# Patient Record
Sex: Female | Born: 1940 | Race: White | Hispanic: No | Smoking: Never smoker
Health system: Southern US, Community
[De-identification: ages and names within clinical notes are randomized; demographics above are authoritative.]

## PROBLEM LIST (undated history)

## (undated) DIAGNOSIS — C801 Malignant (primary) neoplasm, unspecified: Secondary | ICD-10-CM

## (undated) DIAGNOSIS — M1990 Unspecified osteoarthritis, unspecified site: Secondary | ICD-10-CM

## (undated) DIAGNOSIS — J45909 Unspecified asthma, uncomplicated: Secondary | ICD-10-CM

## (undated) DIAGNOSIS — I10 Essential (primary) hypertension: Secondary | ICD-10-CM

## (undated) HISTORY — PX: ABDOMINAL HYSTERECTOMY: SHX81

## (undated) HISTORY — DX: Unspecified asthma, uncomplicated: J45.909

## (undated) HISTORY — DX: Malignant (primary) neoplasm, unspecified: C80.1

## (undated) HISTORY — PX: OTHER SURGICAL HISTORY: SHX169

## (undated) HISTORY — PX: BLADDER SURGERY: SHX569

## (undated) HISTORY — PX: POLYPECTOMY: SHX149

## (undated) HISTORY — DX: Unspecified osteoarthritis, unspecified site: M19.90

## (undated) HISTORY — DX: Essential (primary) hypertension: I10

---

## 2001-06-26 ENCOUNTER — Other Ambulatory Visit: Admission: RE | Admit: 2001-06-26 | Discharge: 2001-06-26 | Payer: Self-pay | Admitting: Family Medicine

## 2001-07-23 ENCOUNTER — Ambulatory Visit: Admission: RE | Admit: 2001-07-23 | Discharge: 2001-07-23 | Payer: Self-pay | Admitting: Gynecologic Oncology

## 2001-07-25 ENCOUNTER — Encounter: Payer: Self-pay | Admitting: Obstetrics and Gynecology

## 2001-07-29 ENCOUNTER — Inpatient Hospital Stay (HOSPITAL_COMMUNITY): Admission: RE | Admit: 2001-07-29 | Discharge: 2001-08-01 | Payer: Self-pay | Admitting: Obstetrics and Gynecology

## 2001-07-29 ENCOUNTER — Encounter (INDEPENDENT_AMBULATORY_CARE_PROVIDER_SITE_OTHER): Payer: Self-pay | Admitting: Specialist

## 2001-09-16 ENCOUNTER — Encounter: Admission: RE | Admit: 2001-09-16 | Discharge: 2001-09-16 | Payer: Self-pay | Admitting: Obstetrics and Gynecology

## 2001-09-16 ENCOUNTER — Encounter: Payer: Self-pay | Admitting: Obstetrics and Gynecology

## 2001-11-19 ENCOUNTER — Other Ambulatory Visit: Admission: RE | Admit: 2001-11-19 | Discharge: 2001-11-19 | Payer: Self-pay | Admitting: Obstetrics and Gynecology

## 2002-02-12 ENCOUNTER — Other Ambulatory Visit: Admission: RE | Admit: 2002-02-12 | Discharge: 2002-02-12 | Payer: Self-pay | Admitting: Obstetrics and Gynecology

## 2002-05-25 ENCOUNTER — Other Ambulatory Visit: Admission: RE | Admit: 2002-05-25 | Discharge: 2002-05-25 | Payer: Self-pay | Admitting: Obstetrics and Gynecology

## 2002-10-14 ENCOUNTER — Encounter: Admission: RE | Admit: 2002-10-14 | Discharge: 2002-10-14 | Payer: Self-pay | Admitting: Obstetrics and Gynecology

## 2002-10-14 ENCOUNTER — Encounter: Payer: Self-pay | Admitting: Obstetrics and Gynecology

## 2002-11-19 ENCOUNTER — Other Ambulatory Visit: Admission: RE | Admit: 2002-11-19 | Discharge: 2002-11-19 | Payer: Self-pay | Admitting: Obstetrics and Gynecology

## 2003-05-06 ENCOUNTER — Other Ambulatory Visit: Admission: RE | Admit: 2003-05-06 | Discharge: 2003-05-06 | Payer: Self-pay | Admitting: Obstetrics and Gynecology

## 2003-12-01 ENCOUNTER — Other Ambulatory Visit: Admission: RE | Admit: 2003-12-01 | Discharge: 2003-12-01 | Payer: Self-pay | Admitting: Obstetrics and Gynecology

## 2004-05-16 ENCOUNTER — Other Ambulatory Visit: Admission: RE | Admit: 2004-05-16 | Discharge: 2004-05-16 | Payer: Self-pay | Admitting: Obstetrics and Gynecology

## 2004-12-13 ENCOUNTER — Other Ambulatory Visit: Admission: RE | Admit: 2004-12-13 | Discharge: 2004-12-13 | Payer: Self-pay | Admitting: Obstetrics and Gynecology

## 2004-12-27 ENCOUNTER — Encounter: Admission: RE | Admit: 2004-12-27 | Discharge: 2004-12-27 | Payer: Self-pay | Admitting: Family Medicine

## 2005-06-19 ENCOUNTER — Other Ambulatory Visit: Admission: RE | Admit: 2005-06-19 | Discharge: 2005-06-19 | Payer: Self-pay | Admitting: Obstetrics and Gynecology

## 2006-01-01 ENCOUNTER — Encounter: Admission: RE | Admit: 2006-01-01 | Discharge: 2006-01-01 | Payer: Self-pay | Admitting: Obstetrics and Gynecology

## 2006-01-09 ENCOUNTER — Other Ambulatory Visit: Admission: RE | Admit: 2006-01-09 | Discharge: 2006-01-09 | Payer: Self-pay | Admitting: Obstetrics and Gynecology

## 2006-07-25 ENCOUNTER — Other Ambulatory Visit: Admission: RE | Admit: 2006-07-25 | Discharge: 2006-07-25 | Payer: Self-pay | Admitting: Obstetrics and Gynecology

## 2007-01-20 ENCOUNTER — Encounter: Admission: RE | Admit: 2007-01-20 | Discharge: 2007-01-20 | Payer: Self-pay | Admitting: Obstetrics and Gynecology

## 2007-07-15 ENCOUNTER — Other Ambulatory Visit: Admission: RE | Admit: 2007-07-15 | Discharge: 2007-07-15 | Payer: Self-pay | Admitting: Obstetrics and Gynecology

## 2008-11-01 ENCOUNTER — Encounter: Admission: RE | Admit: 2008-11-01 | Discharge: 2008-11-01 | Payer: Self-pay | Admitting: Family Medicine

## 2008-11-11 ENCOUNTER — Ambulatory Visit: Payer: Self-pay | Admitting: Internal Medicine

## 2008-11-25 ENCOUNTER — Ambulatory Visit: Payer: Self-pay | Admitting: Internal Medicine

## 2008-11-25 ENCOUNTER — Encounter: Payer: Self-pay | Admitting: Internal Medicine

## 2008-11-25 HISTORY — PX: COLONOSCOPY: SHX174

## 2008-11-26 ENCOUNTER — Encounter: Payer: Self-pay | Admitting: Internal Medicine

## 2010-04-19 DIAGNOSIS — J309 Allergic rhinitis, unspecified: Secondary | ICD-10-CM | POA: Insufficient documentation

## 2010-04-19 HISTORY — DX: Allergic rhinitis, unspecified: J30.9

## 2010-07-21 DIAGNOSIS — E559 Vitamin D deficiency, unspecified: Secondary | ICD-10-CM | POA: Insufficient documentation

## 2010-07-21 DIAGNOSIS — F411 Generalized anxiety disorder: Secondary | ICD-10-CM

## 2010-07-21 HISTORY — DX: Vitamin D deficiency, unspecified: E55.9

## 2010-07-21 HISTORY — DX: Generalized anxiety disorder: F41.1

## 2010-09-18 DIAGNOSIS — I10 Essential (primary) hypertension: Secondary | ICD-10-CM | POA: Insufficient documentation

## 2010-09-22 NOTE — H&P (Signed)
[facility]  Patient:    Kristen Mendoza, Kristen Mendoza Visit Number: [id number] MRN: [id number]          Service Type: Attending:  Rande Brunt. Eda Paschal, M.D. Dictated by:   Rande Brunt. Eda Paschal, M.D. Adm. Date:  07/28/01                           History and Physical  CHIEF COMPLAINT:  Adenocarcinoma of the endometrium.  HISTORY OF PRESENT ILLNESS:  The patient is a 70 year old gravida 2, para 2, AB 0, who had been seeing Dr. Karma Ganja.  She had not been on hormonal therapy, but after not having any periods since 1993, starting in January of this year, she had bleeding for six weeks.  She went to see him and he referred her because of the above.  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  She now enters the hospital for total abdominal hysterectomy, bilateral salpingo-oophorectomy, peritoneal washings, and frozen section.  She understands that pending what the above shows, she may undergo lymph node sampling as well.  Dr. Ronita Hipps is going to participate in her surgery.  PAST MEDICAL HISTORY:  The patient is hypertensive.  Her only other medical problem is rosacea.  MEDICATIONS: 1. She takes Pindolol 5 mg tablets, half a tablet daily. 2. She also takes vitamins.  PREVIOUS SURGERY: 1. T&A in 1950. 2. Knee surgery in 1974. 3. She had a bladder tack done in [place] done through an incision and    she said she had wire put in to elevate the bladder.  This was done for    urinary stress incontinence and she has been dry since then. 4. She had tubal ligation in 1977.  FAMILY HISTORY:  Noncontributory, except for a grandfather who had prostate cancer.  SOCIAL HISTORY:  She is a nonsmoker, nondrinker.  REVIEW OF SYSTEMS:  HEENT:  Negative.  CARDIAC:  Reveals hypertension. RESPIRATORY:  Negative.  GASTROINTESTINAL:  Negative.  GENITOURINARY: Negative.  NEUROMUSCULAR:   Negative.  ENDOCRINE:  Negative.  LYMPHATICS: Negative.  PHYSICAL EXAMINATION:  GENERAL:  The patient is a well-developed, well-nourished female in no acute distress.  VITAL SIGNS:  Blood pressure is 134/84, pulse is 80 and regular, respirations 16 and nonlabored.  She is afebrile.  HEENT:  All within normal limits.  NECK:  Supple.  Trachea in the midline.  Thyroid is not enlarged.  LUNGS:  Clear to P&A.  HEART:  No thrills, heaves, or murmurs.  BREASTS:  No masses.  ABDOMEN:  Soft without guarding, rebound, or masses.  PELVIC:  External and vaginal is within normal limits.  Cervix is clean.  Pap smear done showed adenocarcinoma.  Uterus is top normal size and shape. Adnexa are palpably normal.  RECTAL:  Negative.  EXTREMITIES:  Within normal limits.  ADMISSION IMPRESSION:  Moderately differentiated adenocarcinoma of the endometrium.  PLAN:  See above. Dictated by:   Rande Brunt. Eda Paschal, M.D. Attending:  Rande Brunt. Eda Paschal, M.D. DD:  07/28/01 TD:  07/29/01 Job: [id number]

## 2010-09-22 NOTE — Op Note (Signed)
[facility]  Patient:    Kristen Mendoza, Kristen Mendoza Visit Number: [id number] MRN: [id number]          Service Type: GYN Location: 4W 0460 01 Attending Physician:  Sharon Mt Dictated by:   Jackquline Denmark. Kyla Balzarine, M.D. Proc. Date: 07/29/01 Admit Date:  07/29/2001   CC:         Reuel Boom L. Eda Paschal, M.D.  Telford Nab, R.N.   Operative Report  PREOPERATIVE DIAGNOSIS:  Grade II, clinical stage I endometrial cancer.  POSTOPERATIVE DIAGNOSIS:  Grade II, clinical stage I endometrial cancer.  SURGEON: 1. John T. Kyla Balzarine, M.D. 2. Rande Brunt. Eda Paschal, M.D.  PROCEDURE: 1. Exploratory laparotomy with total abdominal hysterectomy and bilateral    salpingo-oophorectomy (Dr. Eda Paschal was primary surgeon). 2. Bilateral selective pelvic lymphadenectomy and washings (Dr. Kyla Balzarine was    primary surgeon).  ANESTHESIA:  General endotracheal.  INDICATIONS FOR PROCEDURE:  This 70 year old woman presented with menopausal bleeding, and biopsy revealed grade II endometrial adenocarcinoma.  At the time of exploratory laparotomy, the uterus was small and there was no gross evidence of extrauterine disease.  Tubes and ovaries appeared normal, and lymph nodes were palpably normal.  Frozen section results returned after bilateral selective pelvic lymphadenectomy had been performed, and revealed a grade I to II, very superficially invasive fundal lesion.  On the basis of this information, extensive periaortic lymphadenectomy was not performed.  DESCRIPTION OF PROCEDURE:  The patient was prepped and draped in the low lithotomy position using direct placement stirrups, and explored through a midline subumbilical incision.  Foley catheter was used to drain the urinary bladder.  The peritoneum was entered atraumatically after developing the incision with the scalpel and electrocautery.  Manual and visual exploration reveal the findings described above.  Normal saline 150 cc was  instilled in the abdominal cavity and collected for cytology as part of surgical staging. A Bookwalter retractor was used at bowel out of the pelvis.  The corners of the uterus were grabbed with long Kelly clamps.  Total abdominal hysterectomy and bilateral salpingo-oophorectomy was performed using 2-0 Vicryl for all sutures and ligatures unless otherwise specified.  Alternating on the right and left sides, the round ligaments bilaterally were transected using electrocautery.  The anterior and posterior leads of the broad ligament were opened, and retroperitoneal space was partially developed, allowing identification of each ureter.  The infundibulopelvic ligaments were isolated above each ureter, isolated, cross clamped, divided, and ligated with three tie and suture ligature.  The anterior peritoneal reflection of the uterus was opened, and bladder flap developed with sharp and blunt dissection with electrocautery for hemostasis.  Uterine vessels bilaterally were skeletonized, cross clamped, divided, and suture ligated.  Alternating on the right and left sides, cardinal ligament pedicles, and uterosacral ligaments pedicles were developed by clamping adjacent to the cervix, dividing and suture ligating.  Final pedicles entered the lateral vaginal fornices, and the specimen was amputated and submitted for frozen section evaluation.  The vaginal cuff was closed with locked running suture line and 0 Vicryl.  Tolerating results of frozen section pathology, bilateral selective pelvic lymphadenectomies were performed in the following manner:  On each side, the perivesical and perirectal spaces were fully developed with identification of side wall vessels and obturator nerve.  Using sharp and blunt dissection with electrocautery for hemostasis, lymph nodes anterior and medial to the external iliac vessels were harvested.  The dissection extended from the bifurcation of the common iliac artery  to the deep circumflex iliac vein bilaterally.  The vein retractor was used to elevate the external iliac vein and the lymph node fat pad laying above the obturator nerve was harvested from the obturator space on each side.  Hot packs were placed in the retroperitoneal spaces, and inspection revealed acceptable hemostasis.  At this juncture, frozen section revealed a very superficially invasive, grade I to II lesion, and it was elected not to perform extensive surgical staging with the aortic lymphadenectomy.  The pelvis was copiously lavaged with normal saline, and additional hemostasis achieved where needed with electrocautery.  All sponges and retractors were removed.  The abdominal wall was closed in layers using a mass running Smead-Jones closure of looped 0 PDS for rectus fascia and muscles, and clips to approximate the skin.  We irrigated between layers, and achieved hemostasis where needed with electrocautery.  The patient tolerated the procedure well, and was returned to the recovery room extubated and in stable condition.  ESTIMATED BLOOD LOSS:  150 cc.  TRANSFUSIONS:  None.  DRAINS:  Packed ______ fully dispended drainage.  PATHOLOGY SPECIMENS:  Uterus, tubes, and ovaries.  Right and left external iliac lymph nodes.  Right and left obturator lymph nodes. Dictated by:   Jackquline Denmark. Kyla Balzarine, M.D. Attending Physician:  Sharon Mt DD:  07/29/01 TD:  07/30/01 Job: [id number]

## 2010-09-22 NOTE — Discharge Summary (Signed)
[facility]  Patient:    Kristen Mendoza, Kristen Mendoza Visit Number: [id number] MRN: [id number]          Service Type: GYN Location: 4W 0460 01 Attending Physician:  Sharon Mt Dictated by:   Rande Brunt. Eda Paschal, M.D. Admit Date:  07/29/2001 Discharge Date: 08/01/2001   CC:         John T. Kyla Balzarine, M.D.   Discharge Summary  HISTORY OF PRESENT ILLNESS:  The patient is a 70 year old female who was found after postmenopausal bleeding to have an adenocarcinoma of the endometrium. As a result of this, she was admitted to the hospital on March 25.  She was taken to the operating room by Dr. Eda Paschal and Dr. Kyla Balzarine.  She underwent total abdominal hysterectomy, bilateral salpingo-oophorectomy, peritoneal washings, and bilateral selective pelvic lymphadenectomy.  Frozen section revealed that there was only superficial invasion of the myometrium, and therefore the surgery was completed at that point.  Postoperatively, she had a slight ileus which cleared with IV fluids.  By the third postoperative day, she was ready for discharge.  DISCHARGE DIET:  Regular.  DISCHARGE ACTIVITY:  Ambulatory.  DISCHARGE MEDICATIONS: 1. Darvocet-N 100. 2. Restoril for sleep.  WOUND CARE:  Routine.  FOLLOWUP:  She is to return to the office in four days for staple removal.  FINAL PATHOLOGY REPORT:  Uterus, ovaries, and fallopian tubes revealed an endometrial adenocarcinoma.  FIGO grade 2 with superficial myometrial invasion.  Left external iliac lymph nodes:  Seven benign.  Left obturator lymph nodes:  Five benign.  Right external iliac lymph nodes:  Three benign. Right obturator lymph nodes:  Five benign.  Peritoneal washings:  No malignant cells identified.  CONDITION ON DISCHARGE:  Improved.  DISCHARGE DIAGNOSIS:  Endometrial adenocarcinoma, stage IB/II.  OPERATIONS:  Total abdominal hysterectomy, bilateral salpingo-oophorectomy, selective pelvic lymphadenectomy with  peritoneal washings. Dictated by:   Rande Brunt. Eda Paschal, M.D. Attending Physician:  Sharon Mt DD:  08/12/01 TD:  08/12/01 Job: [id number]

## 2010-09-22 NOTE — Consult Note (Signed)
[facility]  Patient:    Kristen, Mendoza Visit Number: [id number] MRN: [id number]          Service Type: GON Location: GYN Attending Physician:  Sabino Donovan Dictated by:   Jackquline Denmark. Kyla Balzarine, M.D. Proc. Date: 07/23/01 Admit Date:  07/23/2001   CC:         Kristen Mendoza, M.D.  Dr. Deberah Pelton, R.N.   Consultation Report  REDICTATION TO REPLACE REPORT [phone].  CHIEF COMPLAINT:  This patient is referred by Dr. Eda Mendoza for evaluation of moderately differentiated endometrial adenocarcinoma.  HISTORY OF PRESENT ILLNESS:  This 70 year old woman underwent menopause approximately nine years ago and was never on hormonal replacement therapy. She began vaginal spotting approximately two months ago and had heavy bleeding "like a period" approximately 2 1/2 weeks ago. She was seen by her primary physician and Pap smear was suspicious for endometrial adenocarcinoma. Dr. Eda Mendoza evaluated the patient on June 30, 2001 with endometrial biopsy containing grade 2 endometrioid adenocarcinoma mixed with atypical hyperplasia elements. The patient has continued to have spotting but has not had heavy bleeding since.  Of note, she has had a "bladder tack" where she states that her bladder neck was "suspended" with wire to the pubis in 1989; records of this surgery are not available.  PAST MEDICAL HISTORY:  Significant for hypertension.  PAST SURGICAL HISTORY:  Includes "bladder tack" as above, remote T&A, remote knee surgery and remote laparoscopic BTL.  MEDICATIONS:  1. Pindolol.  2. ______.  3. Vitamins.  ALLERGIES:  None known.  FAMILY HISTORY:  Noncontributory, without breast or ovarian cancer.  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  REVIEW OF SYSTEMS:  SYSTEMIC:  Denies weight loss, fever or chills. ENT: Denies pharyngitis, sinusitis, recent abscess tooth. Denies goiter or  thyroid disease. HEME/LYMPH:  Denies abnormal bleeding or lymphadenopathy. BREASTS: Denies breast mass or discharge, denies mastodynia or focal tenderness. Reports up to date on mammograms. CARDIOPULMONARY:  Denies dyspnea, PND, orthopnea, chest pain suggesting angina or symptoms of congestive heart failure.  GI:  Denies GERD, increasing satiety or bloating, change in bowel function including constipation or diarrhea. Denies melena or hematochezia. Denies tenesmus. GU:  Denies frequent UTIs, urinary incontinence, or urgency. Denies hematuria. OB/GYN:  As per HPI. NEUROLOGIC:  Denies ______, strokes, epilepsy, frequent headaches, depressive type symptoms. ENDOCRINE:  Denies thyroid disease or diabetes.  PHYSICAL EXAMINATION:  VITAL SIGNS:  Weight 178 pounds, blood pressure 150/92, pulse 68, respirations 18.  GENERAL:  The patient is alert and oriented x3 in no acute distress.  ENT:  Benign with clear oropharynx.  LUNGS:  Fields are clear.  HEART:  Sounds regular rate and rhythm without JVD.  ABDOMEN:  Soft and benign without ascites, mass, or organomegaly. A very low transverse incision is well healed as is an umbilical laparoscopic port site. BACK:  There is no back or CVA tenderness.  EXTREMITIES:  Have full range of motion without significant edema. There is on pathologic lymphadenopathy.  PELVIC:  External genitalia and BUS are normal to inspection and palpation. The bladder and urethra are well supported and vagina is clear. Cervix is mobile without lesions. Bimanual and rectovaginal examinations reveal upper limit size uterus, poorly defined secondary to weight and habitus. There is no obvious adnexal mass or parametrial nodularity. Rectovaginal is confirmatory.  ASSESSMENT:  VIGO grade 2 endometrioid adenocarcinoma.  PLAN:  The patient has been discussed with Dr. Eda Mendoza and surgery tentatively  scheduled for next week. I had a lengthy discussion with the patient  regarding the nature of her diagnosis and planned treatment. We both have recommended an exploratory laparotomy with TAH/BSO and peritoneal washings. The uterus would be submitted for frozen section evaluation. While awaiting frozen section, routinely perform bilateral selective pelvic lymphadenectomies, as the incidence of lymph node metastasis even in stage 1A grade 2 uterine malignancies is measurable and she is a young and relatively healthy patient. If deep myometrial penetration, cervical involvement or a more histologically advanced lesion were detected, extensive lymph node sampling including pelvic and aortic lymphadenectomies would be performed. I answered questions posed by the patient regarding the risks and benefits of this approach. We discussed tailoring treatment to the results of surgical staging. The patient appeared satisfied with this information and agrees to surgery as scheduled. Dictated by:   Jackquline Denmark. Kyla Balzarine, M.D. Attending Physician:  Ronita Hipps T DD:  07/23/01 TD:  07/24/01 Job: [id number]

## 2011-07-02 DIAGNOSIS — F5102 Adjustment insomnia: Secondary | ICD-10-CM | POA: Insufficient documentation

## 2011-10-16 ENCOUNTER — Other Ambulatory Visit: Payer: Self-pay | Admitting: Family Medicine

## 2011-10-16 DIAGNOSIS — Z1231 Encounter for screening mammogram for malignant neoplasm of breast: Secondary | ICD-10-CM

## 2011-10-16 DIAGNOSIS — Z780 Asymptomatic menopausal state: Secondary | ICD-10-CM

## 2011-10-30 ENCOUNTER — Ambulatory Visit
Admission: RE | Admit: 2011-10-30 | Discharge: 2011-10-30 | Disposition: A | Discharge status: Home / Self Care | Payer: Medicare Other | Source: Ambulatory Visit | Attending: Family Medicine | Admitting: Family Medicine

## 2011-10-30 DIAGNOSIS — Z780 Asymptomatic menopausal state: Secondary | ICD-10-CM

## 2011-10-30 DIAGNOSIS — Z1231 Encounter for screening mammogram for malignant neoplasm of breast: Secondary | ICD-10-CM

## 2013-11-18 ENCOUNTER — Encounter: Payer: Self-pay | Admitting: Internal Medicine

## 2014-06-24 ENCOUNTER — Other Ambulatory Visit: Payer: Self-pay | Admitting: Family Medicine

## 2014-06-24 DIAGNOSIS — Z1231 Encounter for screening mammogram for malignant neoplasm of breast: Secondary | ICD-10-CM

## 2014-07-01 ENCOUNTER — Ambulatory Visit
Admission: RE | Admit: 2014-07-01 | Discharge: 2014-07-01 | Disposition: A | Discharge status: Home / Self Care | Payer: Medicare Other | Source: Ambulatory Visit | Attending: Family Medicine | Admitting: Family Medicine

## 2014-07-01 DIAGNOSIS — Z1231 Encounter for screening mammogram for malignant neoplasm of breast: Secondary | ICD-10-CM

## 2014-12-01 ENCOUNTER — Encounter: Payer: Self-pay | Admitting: Internal Medicine

## 2015-09-23 ENCOUNTER — Encounter: Payer: Self-pay | Admitting: Internal Medicine

## 2015-11-09 ENCOUNTER — Ambulatory Visit (AMBULATORY_SURGERY_CENTER): Payer: Self-pay | Admitting: *Deleted

## 2015-11-09 VITALS — Ht 62.0 in | Wt 163.0 lb

## 2015-11-09 DIAGNOSIS — Z86010 Personal history of colonic polyps: Secondary | ICD-10-CM

## 2015-11-09 MED ORDER — NA SULFATE-K SULFATE-MG SULF 17.5-3.13-1.6 GM/177ML PO SOLN
1.0000 | Freq: Once | ORAL | Status: DC
Start: 2015-11-09 — End: 2016-01-30

## 2015-11-09 NOTE — Progress Notes (Signed)
No egg or soy allergy known to patient  No issues with past sedation with any surgeries  or procedures, no intubation problems  No diet pills per patient No home 02 use per patient  No blood thinners per patient  Pt denies issues with constipation  emmi declined suprep 112$ ( husband just did a colon )- sample given Medication Samples have been provided to the patient.  Drug name: suprep     Qty: 1  LOTZK:[id number]  Exp.Date: 3-19  The patient has been instructed regarding the correct time, dose, and frequency of taking this medication, including desired effects and most common side effects.   Kristen Mendoza 1:29 PM 11/09/2015

## 2015-11-11 ENCOUNTER — Encounter: Payer: Self-pay | Admitting: Internal Medicine

## 2015-11-23 ENCOUNTER — Encounter: Payer: BC Managed Care – PPO | Admitting: Internal Medicine

## 2016-01-30 ENCOUNTER — Ambulatory Visit (AMBULATORY_SURGERY_CENTER): Payer: Medicare Other | Admitting: Internal Medicine

## 2016-01-30 ENCOUNTER — Encounter: Payer: Self-pay | Admitting: Internal Medicine

## 2016-01-30 VITALS — BP 113/66 | HR 61 | Temp 97.5°F | Resp 15 | Ht 62.0 in | Wt 163.0 lb

## 2016-01-30 DIAGNOSIS — D120 Benign neoplasm of cecum: Secondary | ICD-10-CM

## 2016-01-30 DIAGNOSIS — Z86010 Personal history of colonic polyps: Secondary | ICD-10-CM | POA: Diagnosis not present

## 2016-01-30 MED ORDER — SODIUM CHLORIDE 0.9 % IV SOLN: 500.0000 mL | INTRAVENOUS | Status: DC

## 2016-01-30 NOTE — Progress Notes (Signed)
Called to room to assist during endoscopic procedure.  Patient ID and intended procedure confirmed with present staff. Received instructions for my participation in the procedure from the performing physician.  

## 2016-01-30 NOTE — Patient Instructions (Signed)
YOU HAD AN ENDOSCOPIC PROCEDURE TODAY AT [facility]:   Refer to the procedure report that was given to you for any specific questions about what was found during the examination.  If the procedure report does not answer your questions, please call your gastroenterologist to clarify.  If you requested that your care partner not be given the details of your procedure findings, then the procedure report has been included in a sealed envelope for you to review at your convenience later.  YOU SHOULD EXPECT: Some feelings of bloating in the abdomen. Passage of more gas than usual.  Walking can help get rid of the air that was put into your GI tract during the procedure and reduce the bloating. If you had a lower endoscopy (such as a colonoscopy or flexible sigmoidoscopy) you may notice spotting of blood in your stool or on the toilet paper. If you underwent a bowel prep for your procedure, you may not have a normal bowel movement for a few days.  Please Note:  You might notice some irritation and congestion in your nose or some drainage.  This is from the oxygen used during your procedure.  There is no need for concern and it should clear up in a day or so.  SYMPTOMS TO REPORT IMMEDIATELY:   Following lower endoscopy (colonoscopy or flexible sigmoidoscopy):  Excessive amounts of blood in the stool  Significant tenderness or worsening of abdominal pains  Swelling of the abdomen that is new, acute  Fever of 100F or higher   Following upper endoscopy (EGD)  Vomiting of blood or coffee ground material  New chest pain or pain under the shoulder blades  Painful or persistently difficult swallowing  New shortness of breath  Fever of 100F or higher  Black, tarry-looking stools  For urgent or emergent issues, a gastroenterologist can be reached at any hour by calling [phone].   DIET:  We do recommend a small meal at first, but then you may proceed to your regular diet.  Drink  plenty of fluids but you should avoid alcoholic beverages for 24 hours.  ACTIVITY:  You should plan to take it easy for the rest of today and you should NOT DRIVE or use heavy machinery until tomorrow (because of the sedation medicines used during the test).    FOLLOW UP: Our staff will call the number listed on your records the next business day following your procedure to check on you and address any questions or concerns that you may have regarding the information given to you following your procedure. If we do not reach you, we will leave a message.  However, if you are feeling well and you are not experiencing any problems, there is no need to return our call.  We will assume that you have returned to your regular daily activities without incident.  If any biopsies were taken you will be contacted by phone or by letter within the next 1-3 weeks.  Please call us at [phone] if you have not heard about the biopsies in 3 weeks.    SIGNATURES/CONFIDENTIALITY: You and/or your care partner have signed paperwork which will be entered into your electronic medical record.  These signatures attest to the fact that that the information above on your After Visit Summary has been reviewed and is understood.  Full responsibility of the confidentiality of this discharge information lies with you and/or your care-partner.   Handouts were given to your care partner on polyps, hemorrhoids  diverticulosis, and a high fiber diet with liberal fluid intake You may resume your current medications today. Await biopsy results. Please call if any questions or concerns.

## 2016-01-30 NOTE — Op Note (Signed)
[facility] Patient Name: Kristen Mendoza Procedure Date: 01/30/2016 9:49 AM MRN: [id number] Endoscopist: Docia Chuck. Henrene Pastor , MD Age: 75 Referring MD:  Date of Birth: 12-06-1940 Gender: Female Account #: [id number] Procedure:                Colonoscopy Indications:              Surveillance: Personal history of adenomatous                            polyps on last colonoscopy > 5 years ago, High risk                            colon cancer surveillance: Personal history of                            non-advanced adenoma Medicines:                Monitored Anesthesia Care Procedure:                Pre-Anesthesia Assessment:                           - Prior to the procedure, a History and Physical                            was performed, and patient medications and                            allergies were reviewed. The patient's tolerance of                            previous anesthesia was also reviewed. The risks                            and benefits of the procedure and the sedation                            options and risks were discussed with the patient.                            All questions were answered, and informed consent                            was obtained. Prior Anticoagulants: The patient has                            taken no previous anticoagulant or antiplatelet                            agents. ASA Grade Assessment: II - A patient with                            mild systemic disease. After reviewing the risks  and benefits, the patient was deemed in                            satisfactory condition to undergo the procedure.                           After obtaining informed consent, the colonoscope                            was passed under direct vision. Throughout the                            procedure, the patient's blood pressure, pulse, and                            oxygen saturations were monitored continuously.  The                            Model PCF-[id number]) scope was introduced                            through the anus and advanced to the the cecum,                            identified by appendiceal orifice and ileocecal                            valve. The ileocecal valve, appendiceal orifice,                            and rectum were photographed. The quality of the                            bowel preparation was excellent. The colonoscopy                            was performed without difficulty. The patient                            tolerated the procedure well. The bowel preparation                            used was SUPREP. Scope In: 10:00:08 AM Scope Out: 10:15:18 AM Scope Withdrawal Time: 0 hours 10 minutes 19 seconds  Total Procedure Duration: 0 hours 15 minutes 10 seconds  Findings:                 A 5 mm polyp was found in the cecum. The polyp was                            removed with a cold snare. Resection and retrieval                            were complete.  Multiple diverticula were found in the sigmoid                            colon, with significant stenosis of rectosigmoid                            junction.                           Internal hemorrhoids were found during retroflexion.                           The exam was otherwise without abnormality on                            direct and retroflexion views. Complications:            No immediate complications. Estimated blood loss:                            None. Estimated Blood Loss:     Estimated blood loss: none. Impression:               - One 5 mm polyp in the cecum, removed with a cold                            snare. Resected and retrieved.                           - Diverticulosis in the sigmoid colon, with                            stenosis.                           - Internal hemorrhoids.                           - The examination was otherwise  normal on direct                            and retroflexion views. Recommendation:           - Repeat colonoscopy is not recommended for                            surveillance, given current age and favorable                            findings on today's exam.                           - Patient has a contact number available for                            emergencies. The signs and symptoms of potential  delayed complications were discussed with the                            patient. Return to normal activities tomorrow.                            Written discharge instructions were provided to the                            patient.                           - Resume previous diet.                           - Continue present medications.                           - Await pathology results. Docia Chuck. Henrene Pastor, MD 01/30/2016 10:20:30 AM This report has been signed electronically.

## 2016-01-30 NOTE — Progress Notes (Signed)
No problems noted in the recovery room. maw 

## 2016-01-30 NOTE — Progress Notes (Signed)
Report given to PACU RN, vss 

## 2016-01-31 ENCOUNTER — Telehealth: Payer: Self-pay | Admitting: *Deleted

## 2016-01-31 NOTE — Telephone Encounter (Signed)
  Follow up Call-  Call back number 01/30/2016  Post procedure Call Back phone  # [phone]  Permission to leave phone message Yes  Some recent data might be hidden     Patient questions:  Do you have a fever, pain , or abdominal swelling? No. Pain Score  0 *  Have you tolerated food without any problems? Yes.    Have you been able to return to your normal activities? Yes.    Do you have any questions about your discharge instructions: Diet   No. Medications  No. Follow up visit  No.  Do you have questions or concerns about your Care? No.  Actions: * If pain score is 4 or above: No action needed, pain <4.

## 2016-02-06 ENCOUNTER — Encounter: Payer: Self-pay | Admitting: Internal Medicine

## 2016-10-10 ENCOUNTER — Other Ambulatory Visit: Payer: Self-pay | Admitting: Family Medicine

## 2016-10-10 DIAGNOSIS — Z1231 Encounter for screening mammogram for malignant neoplasm of breast: Secondary | ICD-10-CM

## 2016-10-29 ENCOUNTER — Ambulatory Visit
Admission: RE | Admit: 2016-10-29 | Discharge: 2016-10-29 | Disposition: A | Discharge status: Home / Self Care | Payer: Medicare Other | Source: Ambulatory Visit | Attending: Family Medicine | Admitting: Family Medicine

## 2016-10-29 DIAGNOSIS — Z1231 Encounter for screening mammogram for malignant neoplasm of breast: Secondary | ICD-10-CM

## 2017-11-27 ENCOUNTER — Other Ambulatory Visit: Payer: Self-pay | Admitting: Family Medicine

## 2017-11-27 DIAGNOSIS — E2839 Other primary ovarian failure: Secondary | ICD-10-CM

## 2017-11-27 DIAGNOSIS — Z1231 Encounter for screening mammogram for malignant neoplasm of breast: Secondary | ICD-10-CM

## 2018-01-29 ENCOUNTER — Ambulatory Visit
Admission: RE | Admit: 2018-01-29 | Discharge: 2018-01-29 | Disposition: A | Discharge status: Home / Self Care | Payer: Medicare Other | Source: Ambulatory Visit | Attending: Family Medicine | Admitting: Family Medicine

## 2018-01-29 DIAGNOSIS — E2839 Other primary ovarian failure: Secondary | ICD-10-CM

## 2018-01-29 DIAGNOSIS — Z1231 Encounter for screening mammogram for malignant neoplasm of breast: Secondary | ICD-10-CM

## 2019-03-06 ENCOUNTER — Other Ambulatory Visit (HOSPITAL_BASED_OUTPATIENT_CLINIC_OR_DEPARTMENT_OTHER): Payer: Self-pay | Admitting: Family Medicine

## 2019-03-06 DIAGNOSIS — Z1231 Encounter for screening mammogram for malignant neoplasm of breast: Secondary | ICD-10-CM

## 2019-03-10 ENCOUNTER — Other Ambulatory Visit: Payer: Self-pay

## 2019-03-10 ENCOUNTER — Ambulatory Visit (HOSPITAL_BASED_OUTPATIENT_CLINIC_OR_DEPARTMENT_OTHER)
Admission: RE | Admit: 2019-03-10 | Discharge: 2019-03-10 | Disposition: A | Discharge status: Home / Self Care | Payer: Medicare Other | Source: Ambulatory Visit | Attending: Family Medicine | Admitting: Family Medicine

## 2019-03-10 DIAGNOSIS — Z1231 Encounter for screening mammogram for malignant neoplasm of breast: Secondary | ICD-10-CM | POA: Diagnosis not present

## 2019-06-15 ENCOUNTER — Ambulatory Visit: Payer: Medicare PPO

## 2019-06-19 ENCOUNTER — Ambulatory Visit: Payer: Medicare PPO | Attending: Internal Medicine

## 2019-06-19 DIAGNOSIS — Z23 Encounter for immunization: Secondary | ICD-10-CM | POA: Insufficient documentation

## 2019-06-19 NOTE — Progress Notes (Signed)
   Covid-19 Vaccination Clinic  Name:  Kristen Mendoza    MRN: [id number] DOB: 09/03/1940  06/19/2019  [name] was observed post Covid-19 immunization for 15 minutes without incidence. She was provided with Vaccine Information Sheet and instruction to access the V-Safe system.   [name] was instructed to call 911 with any severe reactions post vaccine: Marland Kitchen Difficulty breathing  . Swelling of your face and throat  . A fast heartbeat  . A bad rash all over your body  . Dizziness and weakness    Immunizations Administered    Name Date Dose VIS Date Route   Pfizer COVID-19 Vaccine 06/19/2019  1:59 PM 0.3 mL 04/17/2019 Intramuscular   Manufacturer: [place]   Lot: [id number]   [place]: [id number]

## 2019-07-12 ENCOUNTER — Ambulatory Visit: Payer: Medicare PPO | Attending: Internal Medicine

## 2019-07-12 DIAGNOSIS — Z23 Encounter for immunization: Secondary | ICD-10-CM

## 2019-07-12 NOTE — Progress Notes (Signed)
   Covid-19 Vaccination Clinic  Name:  Kristen Mendoza    MRN: [id number] DOB: 1940-05-24  07/12/2019  [name] was observed post Covid-19 immunization for 15 minutes without incident. She was provided with Vaccine Information Sheet and instruction to access the V-Safe system.   [name] was instructed to call 911 with any severe reactions post vaccine: Marland Kitchen Difficulty breathing  . Swelling of face and throat  . A fast heartbeat  . A bad rash all over body  . Dizziness and weakness   Immunizations Administered    Name Date Dose VIS Date Route   Pfizer COVID-19 Vaccine 07/12/2019 12:24 PM 0.3 mL 04/17/2019 Intramuscular   Manufacturer: [place]   Lot: [id number]   [place]: [id number]

## 2019-08-13 ENCOUNTER — Encounter: Payer: Self-pay | Admitting: Internal Medicine

## 2019-08-13 ENCOUNTER — Other Ambulatory Visit: Payer: Self-pay

## 2019-08-13 ENCOUNTER — Ambulatory Visit: Payer: Medicare PPO | Admitting: Internal Medicine

## 2019-08-13 ENCOUNTER — Encounter (INDEPENDENT_AMBULATORY_CARE_PROVIDER_SITE_OTHER): Payer: Self-pay

## 2019-08-13 VITALS — BP 160/82 | HR 88 | Temp 98.0°F | Ht 62.0 in | Wt 162.8 lb

## 2019-08-13 DIAGNOSIS — E538 Deficiency of other specified B group vitamins: Secondary | ICD-10-CM

## 2019-08-13 DIAGNOSIS — C801 Malignant (primary) neoplasm, unspecified: Secondary | ICD-10-CM | POA: Insufficient documentation

## 2019-08-13 DIAGNOSIS — Z0000 Encounter for general adult medical examination without abnormal findings: Secondary | ICD-10-CM

## 2019-08-13 DIAGNOSIS — M1990 Unspecified osteoarthritis, unspecified site: Secondary | ICD-10-CM | POA: Insufficient documentation

## 2019-08-13 DIAGNOSIS — E559 Vitamin D deficiency, unspecified: Secondary | ICD-10-CM | POA: Diagnosis not present

## 2019-08-13 DIAGNOSIS — J45909 Unspecified asthma, uncomplicated: Secondary | ICD-10-CM | POA: Insufficient documentation

## 2019-08-13 DIAGNOSIS — Z23 Encounter for immunization: Secondary | ICD-10-CM

## 2019-08-13 DIAGNOSIS — I10 Essential (primary) hypertension: Secondary | ICD-10-CM | POA: Diagnosis not present

## 2019-08-13 DIAGNOSIS — Z0001 Encounter for general adult medical examination with abnormal findings: Secondary | ICD-10-CM | POA: Insufficient documentation

## 2019-08-13 LAB — TSH: TSH: 2.55 u[IU]/mL (ref 0.35–4.50)

## 2019-08-13 LAB — CBC WITH DIFFERENTIAL/PLATELET
Basophils Absolute: 0.1 K/uL (ref 0.0–0.1)
Basophils Relative: 0.8 % (ref 0.0–3.0)
Eosinophils Absolute: 0.1 K/uL (ref 0.0–0.7)
Eosinophils Relative: 1.2 % (ref 0.0–5.0)
HCT: 40.4 % (ref 36.0–46.0)
Hemoglobin: 13.6 g/dL (ref 12.0–15.0)
Lymphocytes Relative: 25.9 % (ref 12.0–46.0)
Lymphs Abs: 2.3 K/uL (ref 0.7–4.0)
MCHC: 33.6 g/dL (ref 30.0–36.0)
MCV: 93.5 fl (ref 78.0–100.0)
Monocytes Absolute: 0.8 K/uL (ref 0.1–1.0)
Monocytes Relative: 8.6 % (ref 3.0–12.0)
Neutro Abs: 5.7 K/uL (ref 1.4–7.7)
Neutrophils Relative %: 63.5 % (ref 43.0–77.0)
Platelets: 328 K/uL (ref 150.0–400.0)
RBC: 4.32 Mil/uL (ref 3.87–5.11)
RDW: 14 % (ref 11.5–15.5)
WBC: 8.9 K/uL (ref 4.0–10.5)

## 2019-08-13 LAB — URINALYSIS, ROUTINE W REFLEX MICROSCOPIC
Bilirubin Urine: NEGATIVE
Hgb urine dipstick: NEGATIVE
Leukocytes,Ua: NEGATIVE
Nitrite: NEGATIVE
RBC / HPF: NONE SEEN
Specific Gravity, Urine: >=1.03 — AB (ref 1.000–1.030)
Total Protein, Urine: NEGATIVE
Urine Glucose: NEGATIVE
Urobilinogen, UA: 0.2 (ref 0.0–1.0)
WBC, UA: NONE SEEN
pH: 5.5 (ref 5.0–8.0)

## 2019-08-13 LAB — HEPATIC FUNCTION PANEL
ALT: 13 U/L (ref 0–35)
AST: 19 U/L (ref 0–37)
Albumin: 4.4 g/dL (ref 3.5–5.2)
Alkaline Phosphatase: 76 U/L (ref 39–117)
Bilirubin, Direct: 0.2 mg/dL (ref 0.0–0.3)
Total Bilirubin: 1.1 mg/dL (ref 0.2–1.2)
Total Protein: 7.2 g/dL (ref 6.0–8.3)

## 2019-08-13 LAB — BASIC METABOLIC PANEL
BUN: 19 mg/dL (ref 6–23)
CO2: 27 mEq/L (ref 19–32)
Calcium: 9.5 mg/dL (ref 8.4–10.5)
Chloride: 103 mEq/L (ref 96–112)
Creatinine, Ser: 0.83 mg/dL (ref 0.40–1.20)
GFR: 66.71 mL/min (ref >60.00)
Glucose, Bld: 99 mg/dL (ref 70–99)
Potassium: 3.8 mEq/L (ref 3.5–5.1)
Sodium: 139 mEq/L (ref 135–145)

## 2019-08-13 LAB — LIPID PANEL
Cholesterol: 191 mg/dL (ref 0–200)
HDL: 65.3 mg/dL
LDL Cholesterol: 107 mg/dL — ABNORMAL HIGH (ref 0–99)
NonHDL: 125.6
Total CHOL/HDL Ratio: 3
Triglycerides: 95 mg/dL (ref 0.0–149.0)
VLDL: 19 mg/dL (ref 0.0–40.0)

## 2019-08-13 LAB — VITAMIN B12: Vitamin B-12: 861 pg/mL (ref 211–911)

## 2019-08-13 LAB — VITAMIN D 25 HYDROXY (VIT D DEFICIENCY, FRACTURES): VITD: 30.88 ng/mL (ref 30.00–100.00)

## 2019-08-13 MED ORDER — OLMESARTAN MEDOXOMIL 20 MG PO TABS: 10.0000 mg | ORAL_TABLET | Freq: Every day | ORAL | 3 refills | Status: DC

## 2019-08-13 NOTE — Progress Notes (Signed)
Subjective:    Patient ID: Kristen Mendoza, female    DOB: [birth year], 79 y.o.   MRN: [id number]  HPI  Here for wellness and f/u;  Overall doing ok;  Pt denies Chest pain, worsening SOB, DOE, wheezing, orthopnea, PND, worsening LE edema, palpitations, dizziness or syncope.  Pt denies neurological change such as new headache, facial or extremity weakness.  Pt denies polydipsia, polyuria, or low sugar symptoms. Pt states overall good compliance with treatment and medications, good tolerability, and has been trying to follow appropriate diet.  Pt denies worsening depressive symptoms, suicidal ideation or panic. No fever, night sweats, wt loss, loss of appetite, or other constitutional symptoms.  Pt states good ability with ADL's, has low fall risk, home safety reviewed and adequate, no other significant changes in hearing or vision, and only occasionally active with exercise. Out of BP meds, just didn't want to take it, usually lower than 140/90 at home.  Lots of stress with husband dementia.  Past Medical History:  Diagnosis Date  . Allergic rhinitis 04/19/2010   Overview:  Allergic Rhinitis  10/1 IMO update  . Anxiety state 07/21/2010   Overview:  Anxiety Disorder NOS  10/1 IMO update  . Arthritis    right knee  . Asthma    dx'd years ago but no current issues   . Cancer [facility])    endometrial cancer-14 yeras ago- surgery only  . Hypertension   . Vitamin D deficiency 07/21/2010   Overview:  Vitamin D Deficiency  10/1 IMO update   Past Surgical History:  Procedure Laterality Date  . ABDOMINAL HYSTERECTOMY    . BLADDER SURGERY     bladder tack   . COLONOSCOPY  11-25-2008   polyps, tics, hems- TA x 1-[facility]ry   . knee cap dislocation     40+ years ago  . POLYPECTOMY      reports that she has never smoked. She has never used smokeless tobacco. She reports that she does not drink alcohol or use drugs. family history is not on file. No Known Allergies Current Outpatient Medications on File Prior  to Visit  Medication Sig Dispense Refill  . amLODipine (NORVASC) 5 MG tablet Take 5 mg by mouth daily.    . Cholecalciferol (VITAMIN D3) 5000 units TABS Take 2 tablets by mouth daily.     Marland Kitchen losartan (COZAAR) 100 MG tablet Take 100 mg by mouth daily.     Current Facility-Administered Medications on File Prior to Visit  Medication Dose Route Frequency Provider Last Rate Last Admin  . 0.9 %  sodium chloride infusion  500 mL Intravenous Continuous Irene Shipper, MD       Review of Systems All otherwise neg per pt     Objective:   Physical Exam BP (!) 160/82   Pulse 88   Temp 98 F (36.7 C)   Ht 5\' 2"  (1.575 m)   Wt 162 lb 12.8 oz (73.8 kg)   SpO2 98%   BMI 29.78 kg/m  VS noted,  Constitutional: Pt appears in NAD HENT: Head: NCAT.  Right Ear: External ear normal.  Left Ear: External ear normal.  Eyes: . Pupils are equal, round, and reactive to light. Conjunctivae and EOM are normal Nose: without d/c or deformity Neck: Neck supple. Gross normal ROM Cardiovascular: Normal rate and regular rhythm.   Pulmonary/Chest: Effort normal and breath sounds without rales or wheezing.  Abd:  Soft, NT, ND, + BS, no organomegaly Neurological: Pt is alert. At baseline  orientation, motor grossly intact Skin: Skin is warm. No rashes, other new lesions, no LE edema Psychiatric: Pt behavior is normal without agitation  All otherwise neg per pt Lab Results  Component Value Date   WBC 8.9 08/13/2019   HGB 13.6 08/13/2019   HCT 40.4 08/13/2019   PLT 328.0 08/13/2019   GLUCOSE 99 08/13/2019   CHOL 191 08/13/2019   TRIG 95.0 08/13/2019   HDL 65.30 08/13/2019   LDLCALC 107 (H) 08/13/2019   ALT 13 08/13/2019   AST 19 08/13/2019   NA 139 08/13/2019   K 3.8 08/13/2019   CL 103 08/13/2019   CREATININE 0.83 08/13/2019   BUN 19 08/13/2019   CO2 27 08/13/2019   TSH 2.55 08/13/2019      Assessment & Plan:

## 2019-08-13 NOTE — Patient Instructions (Addendum)
You had the Prevnar 13 shot today  Please take all new medication as prescribed - the blood pressure medication  Please continue to follow your BP on a regular basis at home, with the goal to be at least less than 140/90  Please continue all other medications as before, and refills have been done if requested.  Please have the pharmacy call with any other refills you may need.  Please continue your efforts at being more active, low cholesterol diet, and weight control.  You are otherwise up to date with prevention measures today.  Please keep your appointments with your specialists as you may have planned  Please go to the LAB at the blood drawing area for the tests to be done  You will be contacted by phone if any changes need to be made immediately.  Otherwise, you will receive a letter about your results with an explanation, but please check with MyChart first.  Please remember to sign up for MyChart if you have not done so, as this will be important to you in the future with finding out test results, communicating by private email, and scheduling acute appointments online when needed.  Please make an Appointment to return for your 1 year visit, or sooner if needed, with Lab testing by Appointment as well, to be done about 3-5 days before at the [facility] (so this is for TWO appointments - please see the scheduling desk as you leave)

## 2019-08-13 NOTE — Assessment & Plan Note (Signed)

## 2019-08-13 NOTE — Assessment & Plan Note (Addendum)
Mild to mod, for benicar 10 qd, to f/u bp at home and next vist

## 2019-08-18 ENCOUNTER — Telehealth: Payer: Self-pay

## 2019-08-18 NOTE — Telephone Encounter (Signed)
-----   Message from [facility], [place] sent at 08/17/2019  4:38 PM EDT -----  ----- Message ----- From: Biagio Borg, MD Sent: 08/13/2019  10:27 PM EDT To: Otilio Miu, CMA  Please ok to print the recent letter and stamp and send to pt

## 2019-08-18 NOTE — Telephone Encounter (Signed)
Letter printed, stamped and mailed to pt.  Spoke with pt and informed her that the letter will be sent out.

## 2019-09-30 ENCOUNTER — Other Ambulatory Visit: Payer: Self-pay

## 2019-09-30 ENCOUNTER — Encounter: Payer: Self-pay | Admitting: Internal Medicine

## 2019-09-30 ENCOUNTER — Ambulatory Visit: Payer: Medicare PPO | Admitting: Internal Medicine

## 2019-09-30 DIAGNOSIS — R2242 Localized swelling, mass and lump, left lower limb: Secondary | ICD-10-CM | POA: Diagnosis not present

## 2019-09-30 NOTE — Progress Notes (Signed)
Subjective:    Patient ID: Kristen Mendoza, female    DOB: 1941-04-14, 79 y.o.   MRN: [id number]  HPI The patient is here for an acute visit.   Left foot swelling: "Sunday, three days ago, she wore leather sandals and was on her feet a lot.  She noticed some pain on the dorsal aspect of the foot that night.  The past couple of days the foot has been swollen, painful on the dorsal aspect and yesterday it was red.  She had difficulty walking yesterday.   Last night she took asa.  This morning she did not do much and it is better.  There is still some swelling and a little pain.  There is no longer any redness.    She denies right foot swelling.     Medications and allergies reviewed with patient and updated if appropriate.  Patient Active Problem List   Diagnosis Date Noted  . Preventative health care 08/13/2019  . Hypertension   . Cancer (HCC)   . Asthma   . Arthritis   . Anxiety state 07/21/2010  . Vitamin D deficiency 07/21/2010  . Allergic rhinitis 04/19/2010    Current Outpatient Medications on File Prior to Visit  Medication Sig Dispense Refill  . Cholecalciferol (VITAMIN D3) 5000 units TABS Take 2 tablets by mouth daily.     . olmesartan (BENICAR) 20 MG tablet Take 0.5 tablets (10 mg total) by mouth daily. 45 tablet 3   Current Facility-Administered Medications on File Prior to Visit  Medication Dose Route Frequency Provider Last Rate Last Admin  . 0.9 %  sodium chloride infusion  500 mL Intravenous Continuous Perry, John N, MD        Past Medical History:  Diagnosis Date  . Allergic rhinitis 04/19/2010   Overview:  Allergic Rhinitis  10/1 IMO update  . Anxiety state 07/21/2010   Overview:  Anxiety Disorder NOS  10/1 IMO update  . Arthritis    right knee  . Asthma    dx'd years ago but no current issues   . Cancer (HCC)    endometrial cancer-14 yeras ago- surgery only  . Hypertension   . Vitamin D deficiency 07/21/2010   Overview:  Vitamin D Deficiency  10/1  IMO update    Past Surgical History:  Procedure Laterality Date  . ABDOMINAL HYSTERECTOMY    . BLADDER SURGERY     bladder tack   . COLONOSCOPY  11-25-2008   polyps, tics, hems- TA x 1-[facility]   . knee cap dislocation     40" + years ago  . POLYPECTOMY      Social History   Socioeconomic History  . Marital status: Married    Spouse name: Not on file  . Number of children: 2  . Years of education: Not on file  . Highest education level: Not on file  Occupational History  . Not on file  Tobacco Use  . Smoking status: Never Smoker  . Smokeless tobacco: Never Used  Substance and Sexual Activity  . Alcohol use: No    Alcohol/week: 0.0 standard drinks  . Drug use: No  . Sexual activity: Not Currently  Other Topics Concern  . Not on file  Social History Narrative  . Not on file   Social Determinants of Health   Financial Resource Strain:   . Difficulty of Paying Living Expenses:   Food Insecurity:   . Worried About Charity fundraiser in the Last Year:   .  Ran Out of Food in the Last Year:   Transportation Needs:   . Film/video editor (Medical):   Marland Kitchen Lack of Transportation (Non-Medical):   Physical Activity:   . Days of Exercise per Week:   . Minutes of Exercise per Session:   Stress:   . Feeling of Stress :   Social Connections:   . Frequency of Communication with Friends and Family:   . Frequency of Social Gatherings with Friends and Family:   . Attends Religious Services:   . Active Member of Clubs or Organizations:   . Attends Archivist Meetings:   Marland Kitchen Marital Status:     Family History  Problem Relation Age of Onset  . Colon cancer Neg Hx   . Colon polyps Neg Hx   . Esophageal cancer Neg Hx   . Rectal cancer Neg Hx   . Stomach cancer Neg Hx   . Breast cancer Neg Hx     Review of Systems  Constitutional: Negative for chills and fever.  Skin: Negative for rash and wound.  Neurological: Negative for numbness.       Objective:    Vitals:   09/30/19 1057  BP: (!) 176/92  Pulse: 83  Resp: 16  Temp: 98.6 F (37 C)  SpO2: 98%   BP Readings from Last 3 Encounters:  09/30/19 (!) 176/92  08/13/19 (!) 160/82  01/30/16 113/66   Wt Readings from Last 3 Encounters:  09/30/19 161 lb 3.2 oz (73.1 kg)  08/13/19 162 lb 12.8 oz (73.8 kg)  01/30/16 163 lb (73.9 kg)   Body mass index is 29.48 kg/m.   Physical Exam Constitutional:      General: She is not in acute distress.    Appearance: Normal appearance. She is not ill-appearing.  HENT:     Head: Normocephalic and atraumatic.  Musculoskeletal:        General: Swelling (mild swelling proximal dorsal aspect of L foot) and tenderness (minimal tenderness medial, proximal dorsal aspect of L foot) present. No deformity.     Right lower leg: No edema.     Comments: L ankle and foot with normal ROM, no calf tenderness or swelling b/l  Skin:    General: Skin is warm and dry.     Findings: No bruising, erythema, lesion or rash.  Neurological:     Mental Status: She is alert.     Sensory: No sensory deficit.     Motor: No weakness.            Assessment & Plan:    See Problem List for Assessment and Plan of chronic medical problems.    This visit occurred during the SARS-CoV-2 public health emergency.  Safety protocols were in place, including screening questions prior to the visit, additional usage of staff PPE, and extensive cleaning of exam room while observing appropriate contact time as indicated for disinfecting solutions.

## 2019-09-30 NOTE — Patient Instructions (Addendum)
  Keep an eye on your left foot.  If your symptoms do not resolve or flare up again let us know.  You may need to see a foot doctor.     Keep it elevated, apply ice as needed and wear comfortable shoes.

## 2019-09-30 NOTE — Assessment & Plan Note (Signed)
Acute Localized swelling of proximal, dorsal left foot associated with mild pain  Likely tendinitis Symptoms have improved Elevate when able, apply ice, apply voltaren gel, wear comfortable shoes If symptoms persist may need to see podiatry

## 2019-11-21 ENCOUNTER — Encounter: Payer: Self-pay | Admitting: Internal Medicine

## 2020-01-22 ENCOUNTER — Encounter: Payer: Self-pay | Admitting: Internal Medicine

## 2020-02-02 ENCOUNTER — Encounter: Payer: Self-pay | Admitting: Internal Medicine

## 2020-08-18 ENCOUNTER — Encounter: Payer: Self-pay | Admitting: Internal Medicine

## 2020-08-18 NOTE — Telephone Encounter (Signed)
Staff to contact pt -   Please ask her to make OV next wk as she is due for "routine visit"  thanks

## 2020-08-22 MED ORDER — CITALOPRAM HYDROBROMIDE 10 MG PO TABS: 10.0000 mg | ORAL_TABLET | Freq: Every day | ORAL | 3 refills | Status: DC

## 2021-01-08 ENCOUNTER — Encounter: Payer: Self-pay | Admitting: Internal Medicine

## 2021-01-09 ENCOUNTER — Other Ambulatory Visit: Payer: Self-pay | Admitting: Internal Medicine

## 2021-01-09 MED ORDER — NIRMATRELVIR/RITONAVIR (PAXLOVID)TABLET: 3.0000 | ORAL_TABLET | Freq: Two times a day (BID) | ORAL | 0 refills | Status: AC

## 2021-01-10 ENCOUNTER — Other Ambulatory Visit: Payer: Self-pay

## 2021-01-10 ENCOUNTER — Encounter (HOSPITAL_BASED_OUTPATIENT_CLINIC_OR_DEPARTMENT_OTHER): Payer: Self-pay | Admitting: *Deleted

## 2021-01-10 ENCOUNTER — Inpatient Hospital Stay (HOSPITAL_BASED_OUTPATIENT_CLINIC_OR_DEPARTMENT_OTHER)
Admission: EM | Admit: 2021-01-10 | Discharge: 2021-01-12 | DRG: 178 | Disposition: A | Discharge status: Home / Self Care | Payer: Medicare PPO | Attending: Internal Medicine | Admitting: Internal Medicine

## 2021-01-10 DIAGNOSIS — R7401 Elevation of levels of liver transaminase levels: Secondary | ICD-10-CM | POA: Diagnosis present

## 2021-01-10 DIAGNOSIS — E871 Hypo-osmolality and hyponatremia: Secondary | ICD-10-CM | POA: Diagnosis present

## 2021-01-10 DIAGNOSIS — D72829 Elevated white blood cell count, unspecified: Secondary | ICD-10-CM | POA: Diagnosis present

## 2021-01-10 DIAGNOSIS — E861 Hypovolemia: Secondary | ICD-10-CM | POA: Diagnosis present

## 2021-01-10 DIAGNOSIS — I10 Essential (primary) hypertension: Secondary | ICD-10-CM | POA: Diagnosis present

## 2021-01-10 DIAGNOSIS — R55 Syncope and collapse: Secondary | ICD-10-CM | POA: Diagnosis present

## 2021-01-10 DIAGNOSIS — U071 COVID-19: Secondary | ICD-10-CM | POA: Diagnosis present

## 2021-01-10 DIAGNOSIS — E559 Vitamin D deficiency, unspecified: Secondary | ICD-10-CM | POA: Diagnosis present

## 2021-01-10 DIAGNOSIS — Z66 Do not resuscitate: Secondary | ICD-10-CM | POA: Diagnosis present

## 2021-01-10 DIAGNOSIS — Z8542 Personal history of malignant neoplasm of other parts of uterus: Secondary | ICD-10-CM

## 2021-01-10 DIAGNOSIS — M1711 Unilateral primary osteoarthritis, right knee: Secondary | ICD-10-CM | POA: Diagnosis present

## 2021-01-10 DIAGNOSIS — M6282 Rhabdomyolysis: Secondary | ICD-10-CM | POA: Diagnosis present

## 2021-01-10 DIAGNOSIS — E876 Hypokalemia: Secondary | ICD-10-CM | POA: Diagnosis present

## 2021-01-10 DIAGNOSIS — E860 Dehydration: Secondary | ICD-10-CM | POA: Diagnosis present

## 2021-01-10 DIAGNOSIS — R4182 Altered mental status, unspecified: Secondary | ICD-10-CM | POA: Diagnosis not present

## 2021-01-10 LAB — CBC WITH DIFFERENTIAL/PLATELET
Abs Immature Granulocytes: 0.05 10*3/uL (ref 0.00–0.07)
Basophils Absolute: 0.1 10*3/uL (ref 0.0–0.1)
Basophils Relative: 0 %
Eosinophils Absolute: 0.4 10*3/uL (ref 0.0–0.5)
Eosinophils Relative: 3 %
HCT: 41.8 % (ref 36.0–46.0)
Hemoglobin: 14.7 g/dL (ref 12.0–15.0)
Immature Granulocytes: 0 %
Lymphocytes Relative: 10 %
Lymphs Abs: 1.4 10*3/uL (ref 0.7–4.0)
MCH: 31.7 pg (ref 26.0–34.0)
MCHC: 35.2 g/dL (ref 30.0–36.0)
MCV: 90.3 fL (ref 80.0–100.0)
Monocytes Absolute: 1 10*3/uL (ref 0.1–1.0)
Monocytes Relative: 7 %
Neutro Abs: 10.7 10*3/uL — ABNORMAL HIGH (ref 1.7–7.7)
Neutrophils Relative %: 80 %
Platelets: 303 10*3/uL (ref 150–400)
RBC: 4.63 MIL/uL (ref 3.87–5.11)
RDW: 13.2 % (ref 11.5–15.5)
WBC: 13.5 10*3/uL — ABNORMAL HIGH (ref 4.0–10.5)
nRBC: 0 % (ref 0.0–0.2)

## 2021-01-10 LAB — COMPREHENSIVE METABOLIC PANEL
ALT: 46 U/L — ABNORMAL HIGH (ref 0–44)
AST: 147 U/L — ABNORMAL HIGH (ref 15–41)
Albumin: 3.9 g/dL (ref 3.5–5.0)
Alkaline Phosphatase: 56 U/L (ref 38–126)
Anion gap: 8 (ref 5–15)
BUN: 18 mg/dL (ref 8–23)
CO2: 24 mmol/L (ref 22–32)
Calcium: 9 mg/dL (ref 8.9–10.3)
Chloride: 101 mmol/L (ref 98–111)
Creatinine, Ser: 0.86 mg/dL (ref 0.44–1.00)
GFR, Estimated: >60 mL/min (ref >60)
Glucose, Bld: 119 mg/dL — ABNORMAL HIGH (ref 70–99)
Potassium: 3.1 mmol/L — ABNORMAL LOW (ref 3.5–5.1)
Sodium: 133 mmol/L — ABNORMAL LOW (ref 135–145)
Total Bilirubin: 1.5 mg/dL — ABNORMAL HIGH (ref 0.3–1.2)
Total Protein: 7 g/dL (ref 6.5–8.1)

## 2021-01-10 LAB — CK: Total CK: 7934 U/L — ABNORMAL HIGH (ref 38–234)

## 2021-01-10 MED ORDER — SODIUM CHLORIDE 0.9 % IV BOLUS
1000.0000 mL | Freq: Once | INTRAVENOUS | Status: AC
  Administered 2021-01-11: 1000 mL via INTRAVENOUS

## 2021-01-10 MED ORDER — POTASSIUM CHLORIDE CRYS ER 20 MEQ PO TBCR
40.0000 meq | EXTENDED_RELEASE_TABLET | Freq: Once | ORAL | Status: AC
  Administered 2021-01-10: 40 meq via ORAL
  Filled 2021-01-10: qty 2

## 2021-01-10 MED ORDER — LACTATED RINGERS IV BOLUS
1000.0000 mL | Freq: Once | INTRAVENOUS | Status: AC
  Administered 2021-01-10: 1000 mL via INTRAVENOUS

## 2021-01-10 NOTE — ED Notes (Signed)
Kristen Mendoza

## 2021-01-10 NOTE — ED Triage Notes (Signed)
Pt tested positive for COVID 2 days ago.  Per son pt found on floor at house with some confusion today.  Bruising noted to upper arm.  Started Paxlovid yesterday.

## 2021-01-10 NOTE — ED Provider Notes (Signed)
[facility] EMERGENCY DEPARTMENT Provider Note   CSN: [id number] Arrival date & time: 01/10/21  2028     History Chief Complaint  Patient presents with   Fall   Covid Positive    Kristen Mendoza is a 80 y.o. female.  HPI     80 year old female with a history of hypertension, endometrial cancer, anxiety who presents with concern for generalized weakness and near syncope in setting of recent COVID diagnosis.  Diagnosed with COVID on "Sunday On paxlovid 1PM lightheadedness pre-syncope, when walking down hallway, reports she knew her son was trying to get into the house but she felt too fatigued and weak to let him in, stood up to go get dressed then felt lightheaded and did lay down in the hallway. Reports she sat down and then laid down, not sure exactly how long she was there.  Reports that over the last 2 days she has essentially been laying around, sometimes on the floor because she is feeling so weak and fatigued.  She has no taste and has not wanted to eat anything. Low appetite, has not wanted to eat or drink  No easy bleeding or bruising, no hx of lightheadedness Cough without sputum, fatigue No headache, visual changes, nausea, vomiting Has had diarrhea since starting paxlovid, generalized weakness No chest pain or dyspnea, no palpitations or nightsweats Feels like dehydration Denies falls, headache, numbness/weakness/visual changes/facial droop. Has bruise over left arm.  Husband's sister died at age of 95 tonight.  Her husband is on hospice.     Past Medical History:  Diagnosis Date   Allergic rhinitis 04/19/2010   Overview:  Allergic Rhinitis  10/1 IMO update   Anxiety state 07/21/2010   Overview:  Anxiety Disorder NOS  10/1 IMO update   Arthritis    right knee   Asthma    dx'd years ago but no current issues    Cancer (HCC)    endometrial cancer-14 yeras ago- surgery only   Hypertension    Vitamin D deficiency 07/21/2010   Overview:  Vitamin D  Deficiency  10/1 IMO update    Patient Active Problem List   Diagnosis Date Noted   Rhabdomyolysis due to COVID-19 01/11/2021   Localized swelling of left foot 09/30/2019   Preventative health care 08/13/2019   Hypertension    Cancer (HCC)    Asthma    Arthritis    Anxiety state 07/21/2010   Vitamin D deficiency 07/21/2010   Allergic rhinitis 04/19/2010    Past Surgical History:  Procedure Laterality Date   ABDOMINAL HYSTERECTOMY     BLADDER SURGERY     bladder tack    COLONOSCOPY  11-25-2008   polyps, tics, hems- TA x 1-perry    knee cap dislocation     40"$ + years ago   POLYPECTOMY       OB History   No obstetric history on file.     Family History  Problem Relation Age of Onset   Colon cancer Neg Hx    Colon polyps Neg Hx    Esophageal cancer Neg Hx    Rectal cancer Neg Hx    Stomach cancer Neg Hx    Breast cancer Neg Hx     Social History   Tobacco Use   Smoking status: Never   Smokeless tobacco: Never  Vaping Use   Vaping Use: Never used  Substance Use Topics   Alcohol use: No    Alcohol/week: 0.0 standard drinks   Drug use: No  Home Medications Prior to Admission medications   Medication Sig Start Date End Date Taking? Authorizing Provider  Cholecalciferol (VITAMIN D3) 5000 units TABS Take 2 tablets by mouth daily.     [provider]  citalopram (CELEXA) 10 MG tablet Take 1 tablet (10 mg total) by mouth daily. 08/22/20 08/22/21  Biagio Borg, MD  nirmatrelvir/ritonavir EUA (PAXLOVID) 20 x 150 MG & 10 x '100MG'$  TABS Take 3 tablets by mouth 2 (two) times daily for 5 days. Patient GFR is 66. Take nirmatrelvir (150 mg) two tablets twice daily for 5 days and ritonavir (100 mg) one tablet twice daily for 5 days. 01/09/21 01/14/21  Biagio Borg, MD  olmesartan (BENICAR) 20 MG tablet Take 0.5 tablets (10 mg total) by mouth daily. 08/13/19   Biagio Borg, MD    Allergies    Patient has no known allergies.  Review of Systems   Review of Systems   Constitutional:  Positive for activity change, appetite change and fatigue. Negative for fever.  HENT:  Negative for sore throat.   Eyes:  Negative for visual disturbance.  Respiratory:  Positive for cough. Negative for shortness of breath.   Cardiovascular:  Negative for chest pain.  Gastrointestinal:  Positive for diarrhea. Negative for abdominal pain, nausea and vomiting.  Genitourinary:  Negative for difficulty urinating.  Musculoskeletal:  Negative for back pain and neck pain.  Skin:  Negative for rash.  Neurological:  Positive for light-headedness. Negative for syncope, weakness, numbness and headaches.   Physical Exam Updated Vital Signs BP (!) 180/76 (BP Location: Right Arm)   Pulse 77   Temp 99.2 F (37.3 C) (Oral)   Resp 16   Ht '5\' 2"'$  (1.575 m)   Wt 73.1 kg   SpO2 97%   BMI 29.48 kg/m   Physical Exam Vitals and nursing note reviewed.  Constitutional:      General: She is not in acute distress.    Appearance: She is well-developed. She is not diaphoretic.  HENT:     Head: Normocephalic and atraumatic.  Eyes:     Conjunctiva/sclera: Conjunctivae normal.  Cardiovascular:     Rate and Rhythm: Normal rate and regular rhythm.     Heart sounds: Normal heart sounds. No murmur heard.   No friction rub. No gallop.  Pulmonary:     Effort: Pulmonary effort is normal. No respiratory distress.     Breath sounds: Normal breath sounds. No wheezing or rales.  Abdominal:     General: There is no distension.     Palpations: Abdomen is soft.     Tenderness: There is no abdominal tenderness. There is no guarding.  Musculoskeletal:        General: No tenderness.     Cervical back: Normal range of motion.  Skin:    General: Skin is warm and dry.     Findings: No erythema or rash.  Neurological:     Mental Status: She is alert and oriented to person, place, and time.     GCS: GCS eye subscore is 4. GCS verbal subscore is 5. GCS motor subscore is 6.     Cranial Nerves: No  cranial nerve deficit, dysarthria or facial asymmetry.     Sensory: Sensation is intact.     Motor: No weakness, tremor or pronator drift.     Coordination: Coordination is intact. Finger-Nose-Finger Test normal.    ED Results / Procedures / Treatments   Labs (all labs ordered are listed, but only abnormal  results are displayed) Labs Reviewed  CBC WITH DIFFERENTIAL/PLATELET - Abnormal; Notable for the following components:      Result Value   WBC 13.5 (*)    Neutro Abs 10.7 (*)    All other components within normal limits  COMPREHENSIVE METABOLIC PANEL - Abnormal; Notable for the following components:   Sodium 133 (*)    Potassium 3.1 (*)    Glucose, Bld 119 (*)    AST 147 (*)    ALT 46 (*)    Total Bilirubin 1.5 (*)    All other components within normal limits  CK - Abnormal; Notable for the following components:   Total CK 7,934 (*)    All other components within normal limits  RESP PANEL BY RT-PCR (FLU A&B, COVID) [id number]  URINALYSIS, ROUTINE W REFLEX MICROSCOPIC    EKG EKG Interpretation  Date/Time:  Tuesday January 10 2021 22:07:51 EDT Ventricular Rate:  68 PR Interval:  210 QRS Duration: 117 QT Interval:  419 QTC Calculation: 446 R Axis:   -12 Text Interpretation: Sinus rhythm Left ventricular hypertrophy No significant change since last tracing Confirmed by Gareth Morgan [phone]) on 01/10/2021 10:31:52 PM  Radiology No results found.  Procedures Procedures   Medications Ordered in ED Medications  lactated ringers infusion (has no administration in time range)  lactated ringers bolus 1,000 mL (0 mLs Intravenous Stopped 01/10/21 2355)  potassium chloride SA (KLOR-CON) CR tablet 40 mEq (40 mEq Oral Given 01/10/21 2208)  sodium chloride 0.9 % bolus 1,000 mL (1,000 mLs Intravenous New Bag/Given 01/11/21 0007)    ED Course  I have reviewed the triage vital signs and the nursing notes.  Pertinent labs & imaging results that were available during my care of the  patient were reviewed by me and considered in my medical decision making (see chart for details).    MDM Rules/Calculators/A&P                           81 year old female with a history of hypertension, endometrial cancer, anxiety who presents with concern for generalized weakness and near syncope in setting of recent COVID diagnosis.  Has not been eating or drinking, felt lightheaded and generally weak and laid down on ground> Denies focal neurologic symptoms, headache, fall and appears to be reliable historian at this time and doubt ICH or acute CVA.  Given she was laying down on the floor and CK was checked and greater than 7000.  Unclear if this degree of elevation related to her laying on floor or if it is primarily related to COVID or possible medication effect. Cr normal, UA pending.  Concern for rhabdomyolysis in setting of significant CK elevation.  IV fluids ordered.  Discussed with patient who has discussed with her daughter, Allene Dillon who agrees with admission for further care.      Final Clinical Impression(s) / ED Diagnoses Final diagnoses:  Non-traumatic rhabdomyolysis  COVID-19    Rx / DC Orders ED Discharge Orders     None        Gareth Morgan, MD 01/11/21 [phone]

## 2021-01-11 ENCOUNTER — Telehealth: Payer: Self-pay | Admitting: Internal Medicine

## 2021-01-11 ENCOUNTER — Encounter (HOSPITAL_COMMUNITY): Payer: Self-pay | Admitting: Family Medicine

## 2021-01-11 ENCOUNTER — Inpatient Hospital Stay (HOSPITAL_COMMUNITY): Payer: Medicare PPO

## 2021-01-11 DIAGNOSIS — R55 Syncope and collapse: Secondary | ICD-10-CM | POA: Diagnosis present

## 2021-01-11 DIAGNOSIS — U071 COVID-19: Secondary | ICD-10-CM | POA: Diagnosis present

## 2021-01-11 DIAGNOSIS — E876 Hypokalemia: Secondary | ICD-10-CM | POA: Diagnosis present

## 2021-01-11 DIAGNOSIS — E559 Vitamin D deficiency, unspecified: Secondary | ICD-10-CM | POA: Diagnosis present

## 2021-01-11 DIAGNOSIS — M6282 Rhabdomyolysis: Secondary | ICD-10-CM | POA: Diagnosis present

## 2021-01-11 DIAGNOSIS — Z8542 Personal history of malignant neoplasm of other parts of uterus: Secondary | ICD-10-CM | POA: Diagnosis not present

## 2021-01-11 DIAGNOSIS — E871 Hypo-osmolality and hyponatremia: Secondary | ICD-10-CM | POA: Diagnosis present

## 2021-01-11 DIAGNOSIS — I10 Essential (primary) hypertension: Secondary | ICD-10-CM | POA: Diagnosis present

## 2021-01-11 DIAGNOSIS — E861 Hypovolemia: Secondary | ICD-10-CM | POA: Diagnosis present

## 2021-01-11 DIAGNOSIS — Z66 Do not resuscitate: Secondary | ICD-10-CM | POA: Diagnosis present

## 2021-01-11 DIAGNOSIS — M1711 Unilateral primary osteoarthritis, right knee: Secondary | ICD-10-CM | POA: Diagnosis present

## 2021-01-11 DIAGNOSIS — R7401 Elevation of levels of liver transaminase levels: Secondary | ICD-10-CM

## 2021-01-11 DIAGNOSIS — E860 Dehydration: Secondary | ICD-10-CM | POA: Diagnosis present

## 2021-01-11 DIAGNOSIS — D72829 Elevated white blood cell count, unspecified: Secondary | ICD-10-CM | POA: Diagnosis present

## 2021-01-11 LAB — CBC WITH DIFFERENTIAL/PLATELET
Abs Immature Granulocytes: 0.03 10*3/uL (ref 0.00–0.07)
Basophils Absolute: 0.1 10*3/uL (ref 0.0–0.1)
Basophils Relative: 1 %
Eosinophils Absolute: 0.1 10*3/uL (ref 0.0–0.5)
Eosinophils Relative: 1 %
HCT: 37.4 % (ref 36.0–46.0)
Hemoglobin: 12.8 g/dL (ref 12.0–15.0)
Immature Granulocytes: 0 %
Lymphocytes Relative: 20 %
Lymphs Abs: 1.8 10*3/uL (ref 0.7–4.0)
MCH: 31.6 pg (ref 26.0–34.0)
MCHC: 34.2 g/dL (ref 30.0–36.0)
MCV: 92.3 fL (ref 80.0–100.0)
Monocytes Absolute: 0.8 10*3/uL (ref 0.1–1.0)
Monocytes Relative: 9 %
Neutro Abs: 6.6 10*3/uL (ref 1.7–7.7)
Neutrophils Relative %: 69 %
Platelets: 243 10*3/uL (ref 150–400)
RBC: 4.05 MIL/uL (ref 3.87–5.11)
RDW: 13.6 % (ref 11.5–15.5)
WBC: 9.3 10*3/uL (ref 4.0–10.5)
nRBC: 0 % (ref 0.0–0.2)

## 2021-01-11 LAB — C-REACTIVE PROTEIN: CRP: 9.7 mg/dL — ABNORMAL HIGH (ref <1.0)

## 2021-01-11 LAB — CK: Total CK: 4149 U/L — ABNORMAL HIGH (ref 38–234)

## 2021-01-11 LAB — PROCALCITONIN: Procalcitonin: <0.1 ng/mL

## 2021-01-11 LAB — RESP PANEL BY RT-PCR (FLU A&B, COVID) ARPGX2
Influenza A by PCR: NEGATIVE
Influenza B by PCR: NEGATIVE
SARS Coronavirus 2 by RT PCR: POSITIVE — AB

## 2021-01-11 LAB — D-DIMER, QUANTITATIVE: D-Dimer, Quant: 1.21 ug/mL-FEU — ABNORMAL HIGH (ref 0.00–0.50)

## 2021-01-11 LAB — PHOSPHORUS: Phosphorus: 2.8 mg/dL (ref 2.5–4.6)

## 2021-01-11 LAB — MAGNESIUM: Magnesium: 1.8 mg/dL (ref 1.7–2.4)

## 2021-01-11 MED ORDER — ENOXAPARIN SODIUM 40 MG/0.4ML IJ SOSY
40.0000 mg | PREFILLED_SYRINGE | INTRAMUSCULAR | Status: DC
  Administered 2021-01-11 – 2021-01-12 (×2): 40 mg via SUBCUTANEOUS
  Filled 2021-01-11 (×2): qty 0.4

## 2021-01-11 MED ORDER — ACETAMINOPHEN 325 MG PO TABS
650.0000 mg | ORAL_TABLET | Freq: Four times a day (QID) | ORAL | Status: DC | PRN
  Administered 2021-01-11: 650 mg via ORAL
  Filled 2021-01-11: qty 2

## 2021-01-11 MED ORDER — HYDRALAZINE HCL 25 MG PO TABS
25.0000 mg | ORAL_TABLET | Freq: Four times a day (QID) | ORAL | Status: DC | PRN
  Administered 2021-01-11: 25 mg via ORAL

## 2021-01-11 MED ORDER — NIRMATRELVIR/RITONAVIR (PAXLOVID)TABLET
3.0000 | ORAL_TABLET | Freq: Two times a day (BID) | ORAL | Status: DC
  Administered 2021-01-11 – 2021-01-12 (×3): 3 via ORAL

## 2021-01-11 MED ORDER — SODIUM CHLORIDE 0.9 % IV SOLN: INTRAVENOUS | Status: AC

## 2021-01-11 MED ORDER — LACTATED RINGERS IV SOLN: INTRAVENOUS | Status: DC

## 2021-01-11 MED ORDER — HYDRALAZINE HCL 10 MG PO TABS
10.0000 mg | ORAL_TABLET | Freq: Four times a day (QID) | ORAL | Status: DC | PRN
  Administered 2021-01-11: 10 mg via ORAL
  Filled 2021-01-11 (×2): qty 1

## 2021-01-11 MED ORDER — TRAZODONE HCL 50 MG PO TABS
25.0000 mg | ORAL_TABLET | Freq: Once | ORAL | Status: AC
  Administered 2021-01-11: 25 mg via ORAL
  Filled 2021-01-11: qty 1

## 2021-01-11 MED ORDER — ONDANSETRON HCL 4 MG/2ML IJ SOLN: 4.0000 mg | Freq: Four times a day (QID) | INTRAMUSCULAR | Status: DC | PRN

## 2021-01-11 MED ORDER — ENSURE ENLIVE PO LIQD
237.0000 mL | Freq: Two times a day (BID) | ORAL | Status: DC
  Administered 2021-01-11 – 2021-01-12 (×2): 237 mL via ORAL

## 2021-01-11 MED ORDER — GUAIFENESIN-DM 100-10 MG/5ML PO SYRP: 10.0000 mL | ORAL_SOLUTION | ORAL | Status: DC | PRN

## 2021-01-11 MED ORDER — MELATONIN 3 MG PO TABS: 3.0000 mg | ORAL_TABLET | Freq: Once | ORAL | Status: DC

## 2021-01-11 MED ORDER — ADULT MULTIVITAMIN W/MINERALS CH
1.0000 | ORAL_TABLET | Freq: Every day | ORAL | Status: DC
  Administered 2021-01-11 – 2021-01-12 (×2): 1 via ORAL
  Filled 2021-01-11 (×2): qty 1

## 2021-01-11 MED ORDER — BOOST / RESOURCE BREEZE PO LIQD CUSTOM: 1.0000 | ORAL | Status: DC

## 2021-01-11 MED ORDER — ONDANSETRON HCL 4 MG PO TABS: 4.0000 mg | ORAL_TABLET | Freq: Four times a day (QID) | ORAL | Status: DC | PRN

## 2021-01-11 MED ORDER — PROSOURCE PLUS PO LIQD
30.0000 mL | Freq: Every day | ORAL | Status: DC
  Administered 2021-01-11: 30 mL via ORAL
  Filled 2021-01-11 (×2): qty 30

## 2021-01-11 NOTE — Evaluation (Signed)
Physical Therapy Evaluation Patient Details Name: Kristen Mendoza MRN: [id number] DOB: January 15, 1941 Today's Date: 01/11/2021   History of Present Illness  Kristen Mendoza is a 80 y.o. female with medical history significant for anxiety, hypertension, asthma, and recent COVID-19 diagnosis, now presenting to the emergency department 01/10/21 with  fatigue, general weakness, loss of appetite,loose stools. She also developed lightheadedness on standing and reports that she laid down on the floor and  spent a few hours there is until her son arrived, IN ED: sodium 133, potassium 3.1, AST 147, ALT 46, and total bilirubin 1.5.  CBC with leukocytosis to 13,500.  COVID-19 PCR is positive.  Serum CK is 7934.  Clinical Impression  Patient's daughter leaving room and provided information, spouse near the end of life, that the spouse's sister passed last evening  and that patient ambulated  slew footed.   Patient tearful at times.  Patient did ambulate ~ 13' with RW and min assistance, requiring steady assist for balance.  Patient reports that her feet do feel numb at times. Patient should progress to return home if gait stabilizes with increased activity.  Pt admitted with above diagnosis.  Pt currently with functional limitations due to the deficits listed below (see PT Problem List). Pt will benefit from skilled PT to increase their independence and safety with mobility to allow discharge to the venue listed below..  Patient independent  and drives  at baseline.        Follow Up Recommendations OPPT once family matters are under control. May benefit from family available at home at DC. Son in town for a few days only,per patient.    Equipment Recommendations  None recommended by PT    Recommendations for Other Services OT consult     Precautions / Restrictions Precautions Precautions: Fall      Mobility  Bed Mobility Overal bed mobility: Needs Assistance Bed Mobility: Supine to Sit     Supine to sit:  Min guard;HOB elevated     General bed mobility comments: extra time, reports legs are stiff, use of bed rail    Transfers Overall transfer level: Needs assistance Equipment used: Rolling walker (2 wheeled) Transfers: Sit to/from Stand Sit to Stand: Min assist         General transfer comment: steady assistance for  balance upon standing  Ambulation/Gait Ambulation/Gait assistance: Min assist Gait Distance (Feet): 10 Feet Assistive device: Rolling walker (2 wheeled) Gait Pattern/deviations: Step-through pattern Gait velocity: decr   General Gait Details: mildly unbalanced , steady assist.  Stairs            Wheelchair Mobility    Modified Rankin (Stroke Patients Only)       Balance Overall balance assessment: Needs assistance Sitting-balance support: Bilateral upper extremity supported;Feet supported Sitting balance-Leahy Scale: Fair     Standing balance support: During functional activity;Bilateral upper extremity supported Standing balance-Leahy Scale: Poor Standing balance comment: reliant on RW                             Pertinent Vitals/Pain Pain Assessment: No/denies pain    Home Living Family/patient expects to be discharged to:: Private residence Living Arrangements: Alone Available Help at Discharge: Family;Available PRN/intermittently Type of Home: House Home Access: Stairs to enter Entrance Stairs-Rails: Right Entrance Stairs-Number of Steps: 2 Home Layout: One level Home Equipment: Walker - 4 wheels;Walker - 2 wheels Additional Comments: spouse in  [place] near death  Prior Function Level of Independence: Independent         Comments: recently drove to visit spouse     Hand Dominance   Dominant Hand: Right    Extremity/Trunk Assessment   Upper Extremity Assessment Upper Extremity Assessment: Overall WFL for tasks assessed    Lower Extremity Assessment Lower Extremity Assessment: RLE deficits/detail;LLE  deficits/detail;Generalized weakness RLE Deficits / Details: reports feet feel numb, Noted upgoing greatbtoe    Cervical / Trunk Assessment Cervical / Trunk Assessment: Normal  Communication   Communication: No difficulties  Cognition Arousal/Alertness: Awake/alert Behavior During Therapy: WFL for tasks assessed/performed Overall Cognitive Status: Within Functional Limits for tasks assessed                                 General Comments: tearful speaking about spouse      General Comments      Exercises     Assessment/Plan    PT Assessment Patient needs continued PT services  PT Problem List Decreased strength;Decreased mobility;Decreased safety awareness;Decreased knowledge of precautions;Decreased activity tolerance;Decreased balance       PT Treatment Interventions DME instruction;Therapeutic activities;Gait training;Therapeutic exercise;Patient/family education;Stair training;Functional mobility training    PT Goals (Current goals can be found in the Care Plan section)  Acute Rehab PT Goals Patient Stated Goal: to go home, be with my husband PT Goal Formulation: With patient/family Time For Goal Achievement: 01/25/21 Potential to Achieve Goals: Good    Frequency Min 3X/week   Barriers to discharge Decreased caregiver support      Co-evaluation               AM-PAC PT "6 Clicks" Mobility  Outcome Measure Help needed turning from your back to your side while in a flat bed without using bedrails?: A Little Help needed moving from lying on your back to sitting on the side of a flat bed without using bedrails?: A Little Help needed moving to and from a bed to a chair (including a wheelchair)?: A Little Help needed standing up from a chair using your arms (e.g., wheelchair or bedside chair)?: A Little Help needed to walk in hospital room?: A Little Help needed climbing 3-5 steps with a railing? : A Lot 6 Click Score: 17    End of Session  Equipment Utilized During Treatment: Gait belt Activity Tolerance: Patient limited by fatigue Patient left: in chair;with call bell/phone within reach;with chair alarm set Nurse Communication: Mobility status PT Visit Diagnosis: Unsteadiness on feet (R26.81);Difficulty in walking, not elsewhere classified (R26.2)    Time: [id number] PT Time Calculation (min) (ACUTE ONLY): 36 min   Charges:   PT Evaluation $PT Eval Low Complexity: 1 Low PT Treatments $Gait Training: 8-22 mins        Tresa Endo PT Acute Rehabilitation Services Pager [phone] Office [phone]   Claretha Cooper 01/11/2021, 1:51 PM

## 2021-01-11 NOTE — Telephone Encounter (Signed)
Pt is currently hospitalized as inpatient

## 2021-01-11 NOTE — Plan of Care (Signed)
80 y/o female presented from [facility] ED with rhabdomyolysis secondary to COVID-19. AAOx4, calm and cooperative. Discussed POC answered all patient's questions.

## 2021-01-11 NOTE — Progress Notes (Signed)
PROGRESS NOTE    Kristen Mendoza  [id number] DOB: Jun 01, 1940 DOA: 01/10/2021 PCP: Biagio Borg, MD   Brief Narrative:  Kristen Mendoza is a 80 y.o. female with medical history significant for anxiety, hypertension, asthma, and recent COVID-19 diagnosis, now presenting to the emergency department for evaluation of fatigue and general weakness with questionable fall.  Patient found to have profound dehydration rhabdomyolysis at intake, admitted for IV fluids and monitoring.  Assessment & Plan:   Poor p.o. intake, profound dehydration and rhabdomyolysis, POA - Presents with fatigue, general weakness, and loss of appetite in setting of acute COVID-19 infection  - CK 7934 and downtrending appropriately with IV fluids, p.o. intake not yet back to baseline   Syncope versus near syncope  -Questionable fall prior to admission, CT head pending  Recent COVID-19 infection, without respiratory symptoms, POA - Tested positive at home on 9/4 and started Paxlovid 9/5  - Primary sxs are loss of appetite and fatigue  - Continue Paxlovid, supportive care, isolation      Elevated transaminases  -In the setting of rhabdo, dehydration and recent COVID infection   Hyponatremia, hypovolemic -Improving with p.o. intake and IV fluids   Hypokalemia  -Follow repeat labs    DVT prophylaxis: Lovenox   Code Status: DNR, confirmed with patient  Family Communication: Daughter at bedside  Status is: Inpatient  Dispo: The patient is from: Home              Anticipated d/c is to: Pending              Anticipated d/c date is: 48 to 72 hours              Patient currently not medically stable for discharge  Consultants:  None  Procedures:  None  Antimicrobials:  Paxlovid  Subjective: No acute issues or events overnight  Objective: Vitals:   01/11/21 0100 01/11/21 0130 01/11/21 0309 01/11/21 0837  BP: (!) 184/69 (!) 154/108 (!) 165/70 (!) 190/66  Pulse: 82 71 66 70  Resp: (!) 24 (!) '28 16 18   '$ Temp:   98.6 F (37 C) 98.3 F (36.8 C)  TempSrc:   Oral Oral  SpO2: 93% 96% 97% 97%  Weight:      Height:        Intake/Output Summary (Last 24 hours) at 01/11/2021 0902 Last data filed at 01/11/2021 0844 Gross per 24 hour  Intake 1002.34 ml  Output 300 ml  Net 702.34 ml   Filed Weights   01/10/21 2037  Weight: 73.1 kg    Examination:  General exam: Appears calm and comfortable  Respiratory system: Clear to auscultation. Respiratory effort normal. Cardiovascular system: S1 & S2 heard, RRR. No JVD, murmurs, rubs, gallops or clicks. No pedal edema. Gastrointestinal system: Abdomen is nondistended, soft and nontender. No organomegaly or masses felt. Normal bowel sounds heard. Central nervous system: Alert and oriented. No focal neurological deficits. Extremities: Symmetric 5 x 5 power. Skin: No rashes, lesions or ulcers Psychiatry: Judgement and insight appear normal. Mood & affect appropriate.   Data Reviewed: I have personally reviewed following labs and imaging studies  CBC: Recent Labs  Lab 01/10/21 2048 01/11/21 0627  WBC 13.5* 9.3  NEUTROABS 10.7* 6.6  HGB 14.7 12.8  HCT 41.8 37.4  MCV 90.3 92.3  PLT 303 0000000   Basic Metabolic Panel: Recent Labs  Lab 01/10/21 2048 01/11/21 0627  NA 133*  --   K 3.1*  --   CL 101  --  CO2 24  --   GLUCOSE 119*  --   BUN 18  --   CREATININE 0.86  --   CALCIUM 9.0  --   MG  --  1.8  PHOS  --  2.8   GFR: Estimated Creatinine Clearance: 50.5 mL/min (by C-G formula based on SCr of 0.86 mg/dL). Liver Function Tests: Recent Labs  Lab 01/10/21 2048  AST 147*  ALT 46*  ALKPHOS 56  BILITOT 1.5*  PROT 7.0  ALBUMIN 3.9   No results for input(s): LIPASE, AMYLASE in the last 168 hours. No results for input(s): AMMONIA in the last 168 hours. Coagulation Profile: No results for input(s): INR, PROTIME in the last 168 hours. Cardiac Enzymes: Recent Labs  Lab 01/10/21 2048 01/11/21 0627  CKTOTAL 7,934* 4,149*   BNP  (last 3 results) No results for input(s): PROBNP in the last 8760 hours. HbA1C: No results for input(s): HGBA1C in the last 72 hours. CBG: No results for input(s): GLUCAP in the last 168 hours. Lipid Profile: No results for input(s): CHOL, HDL, LDLCALC, TRIG, CHOLHDL, LDLDIRECT in the last 72 hours. Thyroid Function Tests: No results for input(s): TSH, T4TOTAL, FREET4, T3FREE, THYROIDAB in the last 72 hours. Anemia Panel: No results for input(s): VITAMINB12, FOLATE, FERRITIN, TIBC, IRON, RETICCTPCT in the last 72 hours. Sepsis Labs: Recent Labs  Lab 01/11/21 [id number]  PROCALCITON <0.10    Recent Results (from the past 240 hour(s))  Resp Panel by RT-PCR (Flu A&B, Covid) Nasopharyngeal Swab     Status: Abnormal   Collection Time: 01/11/21 12:50 AM   Specimen: Nasopharyngeal Swab; Nasopharyngeal(NP) swabs in vial transport medium  Result Value Ref Range Status   SARS Coronavirus 2 by RT PCR POSITIVE (A) NEGATIVE Final    Comment: RESULT CALLED TO, READ BACK BY AND VERIFIED WITH: KELLIE NEAL, RN ON 01/11/21 AT 0237 BY JAG (NOTE) SARS-CoV-2 target nucleic acids are DETECTED.  The SARS-CoV-2 RNA is generally detectable in upper respiratory specimens during the acute phase of infection. Positive results are indicative of the presence of the identified virus, but do not rule out bacterial infection or co-infection with other pathogens not detected by the test. Clinical correlation with patient history and other diagnostic information is necessary to determine patient infection status. The expected result is Negative.  Fact Sheet for Patients: EntrepreneurPulse.com.au  Fact Sheet for Healthcare Providers: IncredibleEmployment.be  This test is not yet approved or cleared by the [place] FDA and  has been authorized for detection and/or diagnosis of SARS-CoV-2 by FDA under an Emergency Use Authorization (EUA).  This EUA will remain in effect  (meaning this te st can be used) for the duration of  the COVID-19 declaration under Section 564(b)(1) of the Act, 21 U.S.C. section 360bbb-3(b)(1), unless the authorization is terminated or revoked sooner.     Influenza A by PCR NEGATIVE NEGATIVE Final   Influenza B by PCR NEGATIVE NEGATIVE Final    Comment: (NOTE) The Xpert Xpress SARS-CoV-2/FLU/RSV plus assay is intended as an aid in the diagnosis of influenza from Nasopharyngeal swab specimens and should not be used as a sole basis for treatment. Nasal washings and aspirates are unacceptable for Xpert Xpress SARS-CoV-2/FLU/RSV testing.  Fact Sheet for Patients: EntrepreneurPulse.com.au  Fact Sheet for Healthcare Providers: IncredibleEmployment.be  This test is not yet approved or cleared by the [place] FDA and has been authorized for detection and/or diagnosis of SARS-CoV-2 by FDA under an Emergency Use Authorization (EUA). This EUA will remain in effect (meaning this  test can be used) for the duration of the COVID-19 declaration under Section 564(b)(1) of the Act, 21 U.S.C. section 360bbb-3(b)(1), unless the authorization is terminated or revoked.  Performed at [facility], [address] [place], [place] [address]     Radiology Studies: No results found.  Scheduled Meds:  enoxaparin (LOVENOX) injection  40 mg Subcutaneous Q24H   nirmatrelvir/ritonavir EUA  3 tablet Oral BID   Continuous Infusions:  sodium chloride 125 mL/hr at 01/11/21 0637     LOS: 0 days   Time spent: 58mn  Hajra Port C Zyara Riling, DO [facility]  If 7PM-7AM, please contact night-coverage [email or web address]  01/11/2021, 9:02 AM

## 2021-01-11 NOTE — Plan of Care (Signed)
  Problem: Education: Goal: Knowledge of General Education information will improve Description: Including pain rating scale, medication(s)/side effects and non-pharmacologic comfort measures Outcome: Progressing   Problem: Health Behavior/Discharge Planning: Goal: Ability to manage health-related needs will improve Outcome: Progressing   Problem: Clinical Measurements: Goal: Ability to maintain clinical measurements within normal limits will improve Outcome: Progressing Goal: Will remain free from infection Outcome: Progressing Goal: Diagnostic test results will improve Outcome: Progressing   Problem: Activity: Goal: Risk for activity intolerance will decrease Outcome: Progressing   Problem: Elimination: Goal: Will not experience complications related to bowel motility Outcome: Progressing

## 2021-01-11 NOTE — H&P (Signed)
History and Physical    Kristen Mendoza [id number] DOB: Oct 20, 1940 DOA: 01/10/2021  PCP: Biagio Borg, MD   Patient coming from: Home   Chief Complaint: Fatigue, general weakness   HPI: Kristen Mendoza is a 80 y.o. female with medical history significant for anxiety, hypertension, asthma, and recent COVID-19 diagnosis, now presenting to the emergency department for evaluation of fatigue and general weakness.  Patient reports that she tested positive for COVID-19 at home on 01/08/2021, was not having any symptoms at that time but had been visiting her husband at a nursing facility where some other residents or found to have [place].  She started Paxil bid the following day and was beginning to develop loss of appetite, fatigue, and general weakness.  The symptoms progressed and she also developed some loose stools without abdominal pain or vomiting.  Fatigue and general weakness was increasing and she also developed lightheadedness on standing.  She felt so fatigued that she laid down on the floor yesterday and spent a few hours there is until her son arrived.  She had been lightheaded on standing but denies any loss of consciousness and denies fall.  She denies any significant muscle aches and denies swelling.  She had a mild nonproductive cough but no shortness of breath or chest pain.  She reports that she is not experiencing any asthma symptoms and several years, made some dietary changes and no longer takes any antihypertensives, and no longer takes any medication for anxiety.  [facility] ED Course: Upon arrival to the ED, patient is found to be afebrile, saturating mid 90s on room air, and with stable blood pressure.  EKG features sinus rhythm with LVH.  Chemistry panel notable for sodium 133, potassium 3.1, AST 147, ALT 46, and total bilirubin 1.5.  CBC with leukocytosis to 13,500.  COVID-19 PCR is positive.  Serum CK is 7934.  Patient was given 2 L of IV fluids and 40 mEq oral potassium.  She was transferred  to [facility] for ongoing evaluation and management.  Review of Systems:  All other systems reviewed and apart from HPI, are negative.  Past Medical History:  Diagnosis Date   Allergic rhinitis 04/19/2010   Overview:  Allergic Rhinitis  10/1 IMO update   Anxiety state 07/21/2010   Overview:  Anxiety Disorder NOS  10/1 IMO update   Arthritis    right knee   Asthma    dx'd years ago but no current issues    Cancer ([employer])    endometrial cancer-14 yeras ago- surgery only   Hypertension    Vitamin D deficiency 07/21/2010   Overview:  Vitamin D Deficiency  10/1 IMO update    Past Surgical History:  Procedure Laterality Date   ABDOMINAL HYSTERECTOMY     BLADDER SURGERY     bladder tack    COLONOSCOPY  11-25-2008   polyps, tics, hems- TA x 1-perry    knee cap dislocation     40+ years ago   POLYPECTOMY      Social History:   reports that she has never smoked. She has never used smokeless tobacco. She reports that she does not drink alcohol and does not use drugs.  No Known Allergies  Family History  Problem Relation Age of Onset   Colon cancer Neg Hx    Colon polyps Neg Hx    Esophageal cancer Neg Hx    Rectal cancer Neg Hx    Stomach cancer Neg Hx    Breast  cancer Neg Hx      Prior to Admission medications   Medication Sig Start Date End Date Taking? Authorizing Provider  Cholecalciferol (VITAMIN D3) 5000 units TABS Take 2 tablets by mouth daily.     [provider]  citalopram (CELEXA) 10 MG tablet Take 1 tablet (10 mg total) by mouth daily. 08/22/20 08/22/21  Biagio Borg, MD  nirmatrelvir/ritonavir EUA (PAXLOVID) 20 x 150 MG & 10 x '100MG'$  TABS Take 3 tablets by mouth 2 (two) times daily for 5 days. Patient GFR is 66. Take nirmatrelvir (150 mg) two tablets twice daily for 5 days and ritonavir (100 mg) one tablet twice daily for 5 days. 01/09/21 01/14/21  Biagio Borg, MD  olmesartan (BENICAR) 20 MG tablet Take 0.5 tablets (10 mg total) by mouth daily.  08/13/19   Biagio Borg, MD    Physical Exam: Vitals:   01/11/21 0030 01/11/21 0100 01/11/21 0130 01/11/21 0309  BP: (!) 171/70 (!) 184/69 (!) 154/108 (!) 165/70  Pulse: 71 82 71 66  Resp: (!) 25 (!) 24 (!) 28 16  Temp:    98.6 F (37 C)  TempSrc:    Oral  SpO2: 96% 93% 96% 97%  Weight:      Height:        Constitutional: NAD, calm  Eyes: PERTLA, lids and conjunctivae normal ENMT: Mucous membranes are moist. Posterior pharynx clear of any exudate or lesions.   Neck: supple, no masses  Respiratory: no wheezing, no crackles. No accessory muscle use.  Cardiovascular: S1 & S2 heard, regular rate and rhythm. No extremity edema.  Abdomen: No distension, no tenderness, soft. Bowel sounds active.  Musculoskeletal: no clubbing / cyanosis. No joint deformity upper and lower extremities.   Skin: no significant rashes, lesions, ulcers. Warm, dry, well-perfused. Neurologic: CN 2-12 grossly intact. Sensation intact. Moving all extremities.  Psychiatric: Alert and oriented to person, place, and situation. Pleasant and cooperative.    Labs and Imaging on Admission: I have personally reviewed following labs and imaging studies  CBC: Recent Labs  Lab 01/10/21 2048 01/11/21 0627  WBC 13.5* 9.3  NEUTROABS 10.7* 6.6  HGB 14.7 12.8  HCT 41.8 37.4  MCV 90.3 92.3  PLT 303 0000000   Basic Metabolic Panel: Recent Labs  Lab 01/10/21 2048  NA 133*  K 3.1*  CL 101  CO2 24  GLUCOSE 119*  BUN 18  CREATININE 0.86  CALCIUM 9.0   GFR: Estimated Creatinine Clearance: 50.5 mL/min (by C-G formula based on SCr of 0.86 mg/dL). Liver Function Tests: Recent Labs  Lab 01/10/21 2048  AST 147*  ALT 46*  ALKPHOS 56  BILITOT 1.5*  PROT 7.0  ALBUMIN 3.9   No results for input(s): LIPASE, AMYLASE in the last 168 hours. No results for input(s): AMMONIA in the last 168 hours. Coagulation Profile: No results for input(s): INR, PROTIME in the last 168 hours. Cardiac Enzymes: Recent Labs  Lab  01/10/21 2048  CKTOTAL 7,934*   BNP (last 3 results) No results for input(s): PROBNP in the last 8760 hours. HbA1C: No results for input(s): HGBA1C in the last 72 hours. CBG: No results for input(s): GLUCAP in the last 168 hours. Lipid Profile: No results for input(s): CHOL, HDL, LDLCALC, TRIG, CHOLHDL, LDLDIRECT in the last 72 hours. Thyroid Function Tests: No results for input(s): TSH, T4TOTAL, FREET4, T3FREE, THYROIDAB in the last 72 hours. Anemia Panel: No results for input(s): VITAMINB12, FOLATE, FERRITIN, TIBC, IRON, RETICCTPCT in the last 72 hours. Urine  analysis:    Component Value Date/Time   COLORURINE YELLOW 08/13/2019 1421   APPEARANCEUR CLEAR 08/13/2019 1421   LABSPEC >=1.030 (A) 08/13/2019 1421   PHURINE 5.5 08/13/2019 1421   GLUCOSEU NEGATIVE 08/13/2019 1421   HGBUR NEGATIVE 08/13/2019 1421   BILIRUBINUR NEGATIVE 08/13/2019 1421   KETONESUR TRACE (A) 08/13/2019 1421   UROBILINOGEN 0.2 08/13/2019 1421   NITRITE NEGATIVE 08/13/2019 1421   LEUKOCYTESUR NEGATIVE 08/13/2019 1421   Sepsis Labs: '@LABRCNTIP'$ (procalcitonin:4,lacticidven:4) ) Recent Results (from the past 240 hour(s))  Resp Panel by RT-PCR (Flu A&B, Covid) Nasopharyngeal Swab     Status: Abnormal   Collection Time: 01/11/21 12:50 AM   Specimen: Nasopharyngeal Swab; Nasopharyngeal(NP) swabs in vial transport medium  Result Value Ref Range Status   SARS Coronavirus 2 by RT PCR POSITIVE (A) NEGATIVE Final    Comment: RESULT CALLED TO, READ BACK BY AND VERIFIED WITH: KELLIE NEAL, RN ON 01/11/21 AT 0237 BY JAG (NOTE) SARS-CoV-2 target nucleic acids are DETECTED.  The SARS-CoV-2 RNA is generally detectable in upper respiratory specimens during the acute phase of infection. Positive results are indicative of the presence of the identified virus, but do not rule out bacterial infection or co-infection with other pathogens not detected by the test. Clinical correlation with patient history and other  diagnostic information is necessary to determine patient infection status. The expected result is Negative.  Fact Sheet for Patients: EntrepreneurPulse.com.au  Fact Sheet for Healthcare Providers: IncredibleEmployment.be  This test is not yet approved or cleared by the [place] FDA and  has been authorized for detection and/or diagnosis of SARS-CoV-2 by FDA under an Emergency Use Authorization (EUA).  This EUA will remain in effect (meaning this te st can be used) for the duration of  the COVID-19 declaration under Section 564(b)(1) of the Act, 21 U.S.C. section 360bbb-3(b)(1), unless the authorization is terminated or revoked sooner.     Influenza A by PCR NEGATIVE NEGATIVE Final   Influenza B by PCR NEGATIVE NEGATIVE Final    Comment: (NOTE) The Xpert Xpress SARS-CoV-2/FLU/RSV plus assay is intended as an aid in the diagnosis of influenza from Nasopharyngeal swab specimens and should not be used as a sole basis for treatment. Nasal washings and aspirates are unacceptable for Xpert Xpress SARS-CoV-2/FLU/RSV testing.  Fact Sheet for Patients: EntrepreneurPulse.com.au  Fact Sheet for Healthcare Providers: IncredibleEmployment.be  This test is not yet approved or cleared by the [place] FDA and has been authorized for detection and/or diagnosis of SARS-CoV-2 by FDA under an Emergency Use Authorization (EUA). This EUA will remain in effect (meaning this test can be used) for the duration of the COVID-19 declaration under Section 564(b)(1) of the Act, 21 U.S.C. section 360bbb-3(b)(1), unless the authorization is terminated or revoked.  Performed at [facility], [address] [place], [place] [address]      Radiological Exams on Admission: No results found.  EKG: Independently reviewed. Sinus rhythm, LVH.   Assessment/Plan   1. Rhabdomyolysis  - Presents with fatigue, general  weakness, and loss of appetite in setting of acute COVID-19 infection and is found to have CK 7934 with preserved renal function  - Secondary to laying on floor and/or acute COVID-19 infection  - She was given 2 liters IVF in ED  - Check urinalysis, continue IVF hydration, monitor and replace electrolytes, monitor renal function    2. COVID-19 infection  - Tested positive at home on 9/4 and started Paxlovid 9/5  - Primary sxs are loss of  appetite and fatigue  - Continue Paxlovid, supportive care, isolation     3. Elevated transaminases  - Mild elevation in transaminases noted on admission  - Benign abdominal exam, likely related to rhabdomyolysis  - Trend   4. Hyponatremia  - Serum sodium is 133 in setting of hypovolemia  - Continue IVF and monitor   5. Hypokalemia  - Replaced in ED  - Repeat chem panel and continue replacement as indicated     DVT prophylaxis: Lovenox   Code Status: DNR, confirmed with patient  Level of Care: Level of care: Med-Surg Family Communication: none present  Disposition Plan:  Patient is from: home  Anticipated d/c is to: TBD Anticipated d/c date is: 01/13/21 Patient currently: pending stable renal function, ability to tolerate adequate oral intake, PT assessment  Consults called: none  Admission status: Inpatient    Vianne Bulls, MD [facility]  01/11/2021, 6:41 AM

## 2021-01-11 NOTE — ED Notes (Signed)
Daughter updated about where pt will be admitted.

## 2021-01-11 NOTE — Telephone Encounter (Signed)
[facility] FYI 9/4 and 9/5...   Caller states her mother is COVID positive at home and she is calling to request Paxlovid. Caller reports sx of congestion, fever of 99.2, fatigue.  Caller notified on call will call Rx for Paxlovid to pharmacy. Caller verbalized understanding.

## 2021-01-11 NOTE — Progress Notes (Signed)
Initial Nutrition Assessment  DOCUMENTATION CODES:   Not applicable  INTERVENTION:  - will order Boost Breeze once/day, each supplement provides 250 kcal and 9 grams of protein. - will order Ensure Plus BID, each supplement provides 350 kcal and 13 grams of protein - will order 30 ml Prosource Plus once/day, each supplement provides 100 kcal and 15 grams protein.  - will order 1 tablet multivitamin with minerals/day.  - weigh patient today.   NUTRITION DIAGNOSIS:   Increased nutrient needs related to acute illness, catabolic illness ([id number] infection) as evidenced by estimated needs.   GOAL:   Patient will meet greater than or equal to 90% of their needs  MONITOR:   PO intake, Supplement acceptance, Labs, Weight trends  REASON FOR ASSESSMENT:   Malnutrition Screening Tool  ASSESSMENT:   80 y.o. female with medical history of anxiety, HTN, asthma, and recent COVID-19 diagnosis. She tested positive for COVID at home on 9/4. She presented to the ED for evaluation of fatigue, generalized weakness, decreased appetite, and some loose stools with N/V.  Patient typically consumes a low sodium diet in order to stay off HTN medications. Appetite was at baseline until the end of last week when she began to experience decreased appetite, increased fatigue, and increased weakness.   She took a home COVID test on 9/4 and it was positive.   Weight yesterday documented as 161 lb but this appears copied forward from weight on 09/30/19. Very limited weight hx available.   Per notes: - rhabdomyolysis thought to be 2/2 laying on the floor PTA and/or d/t COVID-19 infection - elevated transaminases thought to be related to rhabdomyolysis - mild hyponatremia - ongoing hypokalemia following repletion 9/6    Labs reviewed; Na: 133 mmol/l, K: 3.1 mmol/l, AST elevated.  Medications reviewed; 40 mEq Klor-Con x1 dose 9/6. IVF; NS @ 125 ml/hr.   Diet Order:   Diet Order             Diet  Heart Room service appropriate? Yes; Fluid consistency: Thin  Diet effective now                   EDUCATION NEEDS:   No education needs have been identified at this time  Skin:  Skin Assessment: Reviewed RN Assessment  Last BM:  PTA/unknown  Height:   Ht Readings from Last 1 Encounters:  01/10/21 '5\' 2"'$  (1.575 m)    Weight:   Wt Readings from Last 1 Encounters:  01/10/21 73.1 kg     Estimated Nutritional Needs:  Kcal:  1755-1905 kcal Protein:  90-105 grams Fluid:  >/= 2.2 L/day     Jarome Matin, MS, RD, LDN, CNSC Inpatient Clinical Dietitian RD pager # available in [facility]  After hours/weekend pager # available in [facility]

## 2021-01-12 LAB — CBC WITH DIFFERENTIAL/PLATELET
Abs Immature Granulocytes: 0.03 10*3/uL (ref 0.00–0.07)
Basophils Absolute: 0 10*3/uL (ref 0.0–0.1)
Basophils Relative: 0 %
Eosinophils Absolute: 0.2 10*3/uL (ref 0.0–0.5)
Eosinophils Relative: 3 %
HCT: 36.8 % (ref 36.0–46.0)
Hemoglobin: 12.5 g/dL (ref 12.0–15.0)
Immature Granulocytes: 0 %
Lymphocytes Relative: 23 %
Lymphs Abs: 1.6 10*3/uL (ref 0.7–4.0)
MCH: 31.4 pg (ref 26.0–34.0)
MCHC: 34 g/dL (ref 30.0–36.0)
MCV: 92.5 fL (ref 80.0–100.0)
Monocytes Absolute: 0.8 10*3/uL (ref 0.1–1.0)
Monocytes Relative: 11 %
Neutro Abs: 4.5 10*3/uL (ref 1.7–7.7)
Neutrophils Relative %: 63 %
Platelets: 250 10*3/uL (ref 150–400)
RBC: 3.98 MIL/uL (ref 3.87–5.11)
RDW: 13.7 % (ref 11.5–15.5)
WBC: 7.2 10*3/uL (ref 4.0–10.5)
nRBC: 0 % (ref 0.0–0.2)

## 2021-01-12 LAB — BASIC METABOLIC PANEL
Anion gap: 9 (ref 5–15)
BUN: 17 mg/dL (ref 8–23)
CO2: 25 mmol/L (ref 22–32)
Calcium: 8.3 mg/dL — ABNORMAL LOW (ref 8.9–10.3)
Chloride: 105 mmol/L (ref 98–111)
Creatinine, Ser: 0.66 mg/dL (ref 0.44–1.00)
GFR, Estimated: >60 mL/min (ref >60)
Glucose, Bld: 96 mg/dL (ref 70–99)
Potassium: 3.5 mmol/L (ref 3.5–5.1)
Sodium: 139 mmol/L (ref 135–145)

## 2021-01-12 LAB — C-REACTIVE PROTEIN: CRP: 6.6 mg/dL — ABNORMAL HIGH (ref <1.0)

## 2021-01-12 LAB — CK: Total CK: 1670 U/L — ABNORMAL HIGH (ref 38–234)

## 2021-01-12 LAB — D-DIMER, QUANTITATIVE: D-Dimer, Quant: 1.17 ug/mL-FEU — ABNORMAL HIGH (ref 0.00–0.50)

## 2021-01-12 MED ORDER — HYDRALAZINE HCL 25 MG PO TABS: 25.0000 mg | ORAL_TABLET | Freq: Three times a day (TID) | ORAL | 0 refills | Status: DC

## 2021-01-12 NOTE — TOC Transition Note (Signed)
Transition of Care [facility]) - CM/SW Discharge Note   Patient Details  Name: STARLENE SEEFELDT MRN: [id number] Date of Birth: 1940-12-03  Transition of Care [facility]) CM/SW Contact:  Ross Ludwig, LCSW Phone Number: 01/12/2021, 2:00 PM   Clinical Narrative:     Patient will be going home with home health through [place].  CSW signing off please reconsult with any other social work needs, home health agency has been notified of planned discharge.  Patient's daughter has been notified of planned discharge for today.    Final next level of care: [place] Barriers to Discharge: Barriers Resolved   Patient Goals and CMS Choice Patient states their goals for this hospitalization and ongoing recovery are:: To return back home with home health. CMS Medicare.gov Compare Post Acute Care list provided to:: Patient Choice offered to / list presented to : Adult Children  Discharge Placement                       Discharge Plan and Services                DME Arranged: [place] rolling DME Agency: [employer] Date DME Agency Contacted: 01/12/21 Time DME Agency Contacted: 50 Representative spoke with at DME Agency: [facility] HH Arranged: OT, PT [place] Agency: [place] Date [place]: 01/12/21 Time [place]: 1 Representative spoke with at [place]: [place] ([place]) Interventions     Readmission Risk Interventions No flowsheet data found.

## 2021-01-12 NOTE — Evaluation (Signed)
Occupational Therapy Evaluation Patient Details Name: Kristen Mendoza MRN: [id number] DOB: 09/09/1940 Today's Date: 01/12/2021    History of Present Illness Kristen Mendoza is a 80 y.o. female with medical history significant for anxiety, hypertension, asthma, and recent COVID-19 diagnosis, now presenting to the emergency department 01/10/21 with  fatigue, general weakness, loss of appetite,loose stools. She also developed lightheadedness on standing and reports that she laid down on the floor and  spent a few hours there is until her son arrived, IN ED: sodium 133, potassium 3.1, AST 147, ALT 46, and total bilirubin 1.5.  CBC with leukocytosis to 13,500.  COVID-19 PCR is positive.  Serum CK is 7934.   Clinical Impression   Kristen Mendoza presents with generalized weakness and decreased activity tolerance compared to her normal active baseline. On evaluation patient demonstrated ability to perform functional mobility without DME, perform ADLs and perform 17 minutes of active movement and ADLs without overt loss of balance. Patient has a chronic right arthritic knee that impairs her gait and resulting in a mildly altered gait but otherwise ambulated without difficulty. Patient demonstrates ability to safely discharge home with intermittent supervision from family until back to her norma.    Follow Up Recommendations  No OT follow up;Supervision - Intermittent    Equipment Recommendations  None recommended by OT    Recommendations for Other Services       Precautions / Restrictions Precautions Precautions: None Restrictions Weight Bearing Restrictions: No      Mobility Bed Mobility Overal bed mobility: Modified Independent                  Transfers Overall transfer level: Needs assistance Equipment used: None Transfers: Sit to/from Stand Sit to Stand: Supervision         General transfer comment: Supervision for in room ambulation, 3 laps in room, standing for ADLs for a total  of 17 active and standing minutes. Patient needed to randomly steady herself on furniture but no overt loss of balance. Gait stiff due to R knee arthritis.    Balance Overall balance assessment: Mild deficits observed, not formally tested                                         ADL either performed or assessed with clinical judgement   ADL Overall ADL's : Modified independent                                       General ADL Comments: increased time to perform tasks - intermittent need to steady self on furniture     Vision Patient Visual Report: No change from baseline       Perception     Praxis      Pertinent Vitals/Pain Pain Assessment: No/denies pain     Hand Dominance Right   Extremity/Trunk Assessment Upper Extremity Assessment Upper Extremity Assessment: Overall WFL for tasks assessed   Lower Extremity Assessment Lower Extremity Assessment: Defer to PT evaluation   Cervical / Trunk Assessment Cervical / Trunk Assessment: Normal   Communication Communication Communication: No difficulties   Cognition Arousal/Alertness: Awake/alert Behavior During Therapy: WFL for tasks assessed/performed Overall Cognitive Status: Within Functional Limits for tasks assessed  General Comments       Exercises     Shoulder Instructions      Home Living Family/patient expects to be discharged to:: Private residence Living Arrangements: Alone Available Help at Discharge: Family;Available PRN/intermittently Type of Home: House Home Access: Stairs to enter [employer] of Steps: 2 Entrance Stairs-Rails: Right Home Layout: One level     Bathroom Shower/Tub: Walk-in shower         Home Equipment: Environmental consultant - 4 wheels;Walker - 2 wheels   Additional Comments: spouse in  [place] near death      Prior Functioning/Environment Level of Independence: Independent         Comments: recently drove to visit spouse        OT Problem List:        OT Treatment/Interventions:      OT Goals(Current goals can be found in the care plan section) Acute Rehab OT Goals Patient Stated Goal: to go home, be with my husband OT Goal Formulation: All assessment and education complete, DC therapy  OT Frequency:     Barriers to D/C:            Co-evaluation              AM-PAC OT "6 Clicks" Daily Activity     Outcome Measure Help from another person eating meals?: None Help from another person taking care of personal grooming?: None Help from another person toileting, which includes using toliet, bedpan, or urinal?: None Help from another person bathing (including washing, rinsing, drying)?: None Help from another person to put on and taking off regular upper body clothing?: None Help from another person to put on and taking off regular lower body clothing?: None 6 Click Score: 24   End of Session Nurse Communication: Mobility status  Activity Tolerance: Patient tolerated treatment well Patient left: in chair;with call bell/phone within reach  OT Visit Diagnosis: Muscle weakness (generalized) (M62.81);Unsteadiness on feet (R26.81)                Time: [id number] OT Time Calculation (min): 28 min Charges:  OT General Charges $OT Visit: 1 Visit OT Evaluation $OT Eval Low Complexity: 1 Low OT Treatments $Self Care/Home Management : 8-22 mins  Kristen Mendoza, OTR/L [place] [phone] Pager: [place] 01/12/2021, 11:52 AM

## 2021-01-12 NOTE — Discharge Summary (Signed)
Physician Discharge Summary  Kristen Mendoza [id number] DOB: 1941-04-24 DOA: 01/10/2021  PCP: Biagio Borg, MD  Admit date: 01/10/2021 Discharge date: 01/12/2021  Admitted From: Home Disposition: Home  Recommendations for Outpatient Follow-up:  Follow up with PCP in 1-2 weeks Please obtain BMP/CBC in one week  Home Health: PT OT Equipment/Devices: None  Discharge Condition: Stable CODE STATUS: DNR Diet recommendation: As tolerated  Brief/Interim Summary: Kristen Mendoza is a 80 y.o. female with medical history significant for anxiety, hypertension, asthma, and recent COVID-19 diagnosis, now presenting to the emergency department for evaluation of fatigue and general weakness with questionable fall.  Patient found to have profound dehydration rhabdomyolysis at intake, admitted for IV fluids and monitoring.   Assessment & Plan:   Poor p.o. intake, profound dehydration and rhabdomyolysis, POA - Presents with fatigue, general weakness, and loss of appetite in setting of acute COVID-19 infection  - CK 7934 and downtrending appropriately with increased p.o. intake, continue increased free water intake at home over the next few weeks with repeat follow-up labs per PCP   Near syncope  -Patient denies overt syncope, was lightheaded and sat down, CT head negative    Recent COVID-19 infection, without respiratory symptoms, POA - Tested positive at home on 9/4 and started Paxlovid 9/5 -continue until complete  Elevated transaminases  -Resolving with increased p.o. intake and fluids   Hyponatremia, hypovolemic -Improving with p.o. intake -repeat labs with PCP next week   Hypokalemia  -Resolved, repeat labs with PCP next week  Discharge Instructions  Discharge Instructions     Diet - low sodium heart healthy   Complete by: As directed    Increase activity slowly   Complete by: As directed       Allergies as of 01/12/2021   No Known Allergies      Medication List     TAKE these  medications    acetaminophen 650 MG CR tablet Commonly known as: TYLENOL Take 650 mg by mouth every 8 (eight) hours as needed for pain or fever.   citalopram 10 MG tablet Commonly known as: CeleXA Take 1 tablet (10 mg total) by mouth daily.   hydrALAZINE 25 MG tablet Commonly known as: APRESOLINE Take 1 tablet (25 mg total) by mouth 3 (three) times daily.   nirmatrelvir/ritonavir EUA 20 x 150 MG & 10 x '100MG'$  Tabs Commonly known as: PAXLOVID Take 3 tablets by mouth 2 (two) times daily for 5 days. Patient GFR is 66. Take nirmatrelvir (150 mg) two tablets twice daily for 5 days and ritonavir (100 mg) one tablet twice daily for 5 days. What changed:  how much to take when to take this additional instructions   olmesartan 20 MG tablet Commonly known as: BENICAR Take 0.5 tablets (10 mg total) by mouth daily. What changed:  when to take this reasons to take this               Durable Medical Equipment  (From admission, onward)           Start     Ordered   01/12/21 1146  For home use only DME Walker rolling  Once       Question Answer Comment  Walker: With 5 Inch Wheels   Patient needs a walker to treat with the following condition Fear for personal safety      01/12/21 1146            No Known Allergies  Consultations: None  Procedures/Studies: CT HEAD  WO CONTRAST (5MM)  Result Date: 01/11/2021 CLINICAL DATA:  Mental status change, unknown cause EXAM: CT HEAD WITHOUT CONTRAST TECHNIQUE: Contiguous axial images were obtained from the base of the skull through the vertex without intravenous contrast. COMPARISON:  None. FINDINGS: Brain: There is no acute intracranial hemorrhage, mass effect, or edema. Gray-white differentiation is preserved. There is no extra-axial fluid collection. Prominence of the ventricles and sulci reflects generalized parenchymal volume loss. Ventricular prominence is slightly disproportionate. Patchy hypoattenuation in the  supratentorial white matter is nonspecific but may reflect chronic microvascular ischemic changes. Vascular: There is atherosclerotic calcification at the skull base. Skull: Calvarium is unremarkable. Sinuses/Orbits: Patchy mucosal thickening. No acute abnormality of the orbits. Other: None. IMPRESSION: No acute intracranial abnormality. Chronic microvascular ischemic changes. Slightly disproportionate ventricular prominence likely reflects central volume loss. Communicating/normal pressure hydrocephalus is possible in the appropriate setting. Electronically Signed   By: Macy Mis M.D.   On: 01/11/2021 16:46     Subjective: No acute issues or events overnight denies nausea vomiting diarrhea constipation headache fevers chills or chest pain   Discharge Exam: Vitals:   01/11/21 2026 01/12/21 0438  BP: (!) 177/74 (!) 157/56  Pulse: 79 73  Resp: 18 17  Temp: 98.5 F (36.9 C) 98.2 F (36.8 C)  SpO2: 98% 95%   Vitals:   01/11/21 0837 01/11/21 1356 01/11/21 2026 01/12/21 0438  BP: (!) 190/66 (!) 181/75 (!) 177/74 (!) 157/56  Pulse: 70 61 79 73  Resp: '18 16 18 17  '$ Temp: 98.3 F (36.8 C) 98.2 F (36.8 C) 98.5 F (36.9 C) 98.2 F (36.8 C)  TempSrc: Oral Oral Oral Oral  SpO2: 97% 98% 98% 95%  Weight:      Height:        General: Pt is alert, awake, not in acute distress Cardiovascular: RRR, S1/S2 +, no rubs, no gallops Respiratory: CTA bilaterally, no wheezing, no rhonchi Abdominal: Soft, NT, ND, bowel sounds + Extremities: no edema, no cyanosis    The results of significant diagnostics from this hospitalization (including imaging, microbiology, ancillary and laboratory) are listed below for reference.     Microbiology: Recent Results (from the past 240 hour(s))  Resp Panel by RT-PCR (Flu A&B, Covid) Nasopharyngeal Swab     Status: Abnormal   Collection Time: 01/11/21 12:50 AM   Specimen: Nasopharyngeal Swab; Nasopharyngeal(NP) swabs in vial transport medium  Result Value  Ref Range Status   SARS Coronavirus 2 by RT PCR POSITIVE (A) NEGATIVE Final    Comment: RESULT CALLED TO, READ BACK BY AND VERIFIED WITH: KELLIE NEAL, RN ON 01/11/21 AT 0237 BY JAG (NOTE) SARS-CoV-2 target nucleic acids are DETECTED.  The SARS-CoV-2 RNA is generally detectable in upper respiratory specimens during the acute phase of infection. Positive results are indicative of the presence of the identified virus, but do not rule out bacterial infection or co-infection with other pathogens not detected by the test. Clinical correlation with patient history and other diagnostic information is necessary to determine patient infection status. The expected result is Negative.  Fact Sheet for Patients: EntrepreneurPulse.com.au  Fact Sheet for Healthcare Providers: IncredibleEmployment.be  This test is not yet approved or cleared by the [place] FDA and  has been authorized for detection and/or diagnosis of SARS-CoV-2 by FDA under an Emergency Use Authorization (EUA).  This EUA will remain in effect (meaning this te st can be used) for the duration of  the COVID-19 declaration under Section 564(b)(1) of the Act, 21 U.S.C. section 360bbb-3(b)(1), unless  the authorization is terminated or revoked sooner.     Influenza A by PCR NEGATIVE NEGATIVE Final   Influenza B by PCR NEGATIVE NEGATIVE Final    Comment: (NOTE) The Xpert Xpress SARS-CoV-2/FLU/RSV plus assay is intended as an aid in the diagnosis of influenza from Nasopharyngeal swab specimens and should not be used as a sole basis for treatment. Nasal washings and aspirates are unacceptable for Xpert Xpress SARS-CoV-2/FLU/RSV testing.  Fact Sheet for Patients: EntrepreneurPulse.com.au  Fact Sheet for Healthcare Providers: IncredibleEmployment.be  This test is not yet approved or cleared by the [place] FDA and has been authorized for detection  and/or diagnosis of SARS-CoV-2 by FDA under an Emergency Use Authorization (EUA). This EUA will remain in effect (meaning this test can be used) for the duration of the COVID-19 declaration under Section 564(b)(1) of the Act, 21 U.S.C. section 360bbb-3(b)(1), unless the authorization is terminated or revoked.  Performed at [facility], [address] [place], [place] [address]      Labs: BNP (last 3 results) No results for input(s): BNP in the last 8760 hours. Basic Metabolic Panel: Recent Labs  Lab 01/10/21 2048 01/11/21 0627 01/12/21 0336  NA 133*  --  139  K 3.1*  --  3.5  CL 101  --  105  CO2 24  --  25  GLUCOSE 119*  --  96  BUN 18  --  17  CREATININE 0.86  --  0.66  CALCIUM 9.0  --  8.3*  MG  --  1.8  --   PHOS  --  2.8  --    Liver Function Tests: Recent Labs  Lab 01/10/21 2048  AST 147*  ALT 46*  ALKPHOS 56  BILITOT 1.5*  PROT 7.0  ALBUMIN 3.9   No results for input(s): LIPASE, AMYLASE in the last 168 hours. No results for input(s): AMMONIA in the last 168 hours. CBC: Recent Labs  Lab 01/10/21 2048 01/11/21 0627 01/12/21 0336  WBC 13.5* 9.3 7.2  NEUTROABS 10.7* 6.6 4.5  HGB 14.7 12.8 12.5  HCT 41.8 37.4 36.8  MCV 90.3 92.3 92.5  PLT 303 243 250   Cardiac Enzymes: Recent Labs  Lab 01/10/21 2048 01/11/21 0627 01/12/21 0336  CKTOTAL 7,934* 4,149* 1,670*   BNP: Invalid input(s): POCBNP CBG: No results for input(s): GLUCAP in the last 168 hours. D-Dimer Recent Labs    01/11/21 0627 01/12/21 0336  DDIMER 1.21* 1.17*   Hgb A1c No results for input(s): HGBA1C in the last 72 hours. Lipid Profile No results for input(s): CHOL, HDL, LDLCALC, TRIG, CHOLHDL, LDLDIRECT in the last 72 hours. Thyroid function studies No results for input(s): TSH, T4TOTAL, T3FREE, THYROIDAB in the last 72 hours.  Invalid input(s): FREET3 Anemia work up No results for input(s): VITAMINB12, FOLATE, FERRITIN, TIBC, IRON, RETICCTPCT in the last 72  hours. Urinalysis    Component Value Date/Time   COLORURINE YELLOW 08/13/2019 1421   APPEARANCEUR CLEAR 08/13/2019 1421   LABSPEC >=1.030 (A) 08/13/2019 1421   PHURINE 5.5 08/13/2019 1421   GLUCOSEU NEGATIVE 08/13/2019 1421   HGBUR NEGATIVE 08/13/2019 1421   BILIRUBINUR NEGATIVE 08/13/2019 1421   KETONESUR TRACE (A) 08/13/2019 1421   UROBILINOGEN 0.2 08/13/2019 1421   NITRITE NEGATIVE 08/13/2019 1421   LEUKOCYTESUR NEGATIVE 08/13/2019 1421   Sepsis Labs Invalid input(s): PROCALCITONIN,  WBC,  LACTICIDVEN Microbiology Recent Results (from the past 240 hour(s))  Resp Panel by RT-PCR (Flu A&B, Covid) Nasopharyngeal Swab     Status:  Abnormal   Collection Time: 01/11/21 12:50 AM   Specimen: Nasopharyngeal Swab; Nasopharyngeal(NP) swabs in vial transport medium  Result Value Ref Range Status   SARS Coronavirus 2 by RT PCR POSITIVE (A) NEGATIVE Final    Comment: RESULT CALLED TO, READ BACK BY AND VERIFIED WITH: KELLIE NEAL, RN ON 01/11/21 AT 0237 BY JAG (NOTE) SARS-CoV-2 target nucleic acids are DETECTED.  The SARS-CoV-2 RNA is generally detectable in upper respiratory specimens during the acute phase of infection. Positive results are indicative of the presence of the identified virus, but do not rule out bacterial infection or co-infection with other pathogens not detected by the test. Clinical correlation with patient history and other diagnostic information is necessary to determine patient infection status. The expected result is Negative.  Fact Sheet for Patients: EntrepreneurPulse.com.au  Fact Sheet for Healthcare Providers: IncredibleEmployment.be  This test is not yet approved or cleared by the [place] FDA and  has been authorized for detection and/or diagnosis of SARS-CoV-2 by FDA under an Emergency Use Authorization (EUA).  This EUA will remain in effect (meaning this te st can be used) for the duration of  the COVID-19  declaration under Section 564(b)(1) of the Act, 21 U.S.C. section 360bbb-3(b)(1), unless the authorization is terminated or revoked sooner.     Influenza A by PCR NEGATIVE NEGATIVE Final   Influenza B by PCR NEGATIVE NEGATIVE Final    Comment: (NOTE) The Xpert Xpress SARS-CoV-2/FLU/RSV plus assay is intended as an aid in the diagnosis of influenza from Nasopharyngeal swab specimens and should not be used as a sole basis for treatment. Nasal washings and aspirates are unacceptable for Xpert Xpress SARS-CoV-2/FLU/RSV testing.  Fact Sheet for Patients: EntrepreneurPulse.com.au  Fact Sheet for Healthcare Providers: IncredibleEmployment.be  This test is not yet approved or cleared by the [place] FDA and has been authorized for detection and/or diagnosis of SARS-CoV-2 by FDA under an Emergency Use Authorization (EUA). This EUA will remain in effect (meaning this test can be used) for the duration of the COVID-19 declaration under Section 564(b)(1) of the Act, 21 U.S.C. section 360bbb-3(b)(1), unless the authorization is terminated or revoked.  Performed at [facility], [address] [place], [place] [address]      Time coordinating discharge: Over 30 minutes  SIGNED:   Little Ishikawa, DO [facility] 01/12/2021, 1:52 PM Pager   If 7PM-7AM, please contact night-coverage [email or web address]

## 2021-01-13 ENCOUNTER — Telehealth: Payer: Self-pay

## 2021-01-13 NOTE — Telephone Encounter (Cosign Needed)
Transition Care Management Unsuccessful Follow-up Telephone Call  Date of discharge and from where:  01/12/21 [place]   Attempts:  1st Attempt  Reason for unsuccessful TCM follow-up call:  Left voice message

## 2021-01-17 ENCOUNTER — Encounter: Payer: Self-pay | Admitting: Internal Medicine

## 2021-01-17 ENCOUNTER — Telehealth: Payer: Self-pay | Admitting: Internal Medicine

## 2021-01-17 NOTE — Telephone Encounter (Signed)
Patient having trouble sleeping.. says she has tried OTC melatonin but it has not been working  Would like to know if provider can prescribe & send rx to pharmacy to help w/ this  [id number] [place] Phone:  [phone]:  [phone]

## 2021-01-18 MED ORDER — TRAZODONE HCL 50 MG PO TABS: 25.0000 mg | ORAL_TABLET | Freq: Every evening | ORAL | 1 refills | Status: DC | PRN

## 2021-01-18 MED ORDER — TEMAZEPAM 7.5 MG PO CAPS: ORAL_CAPSULE | ORAL | 0 refills | Status: DC

## 2021-01-18 NOTE — Telephone Encounter (Signed)
Ok for trazodone - 50 mg qhs prn - done erx

## 2021-01-24 ENCOUNTER — Telehealth: Payer: Self-pay | Admitting: Internal Medicine

## 2021-01-24 DIAGNOSIS — R7401 Elevation of levels of liver transaminase levels: Secondary | ICD-10-CM | POA: Diagnosis not present

## 2021-01-24 DIAGNOSIS — M6282 Rhabdomyolysis: Secondary | ICD-10-CM | POA: Diagnosis not present

## 2021-01-24 DIAGNOSIS — F419 Anxiety disorder, unspecified: Secondary | ICD-10-CM | POA: Diagnosis not present

## 2021-01-24 DIAGNOSIS — J45909 Unspecified asthma, uncomplicated: Secondary | ICD-10-CM | POA: Diagnosis not present

## 2021-01-24 DIAGNOSIS — U071 COVID-19: Secondary | ICD-10-CM | POA: Diagnosis not present

## 2021-01-24 DIAGNOSIS — M1711 Unilateral primary osteoarthritis, right knee: Secondary | ICD-10-CM | POA: Diagnosis not present

## 2021-01-24 DIAGNOSIS — E860 Dehydration: Secondary | ICD-10-CM | POA: Diagnosis not present

## 2021-01-24 DIAGNOSIS — I10 Essential (primary) hypertension: Secondary | ICD-10-CM | POA: Diagnosis not present

## 2021-01-24 DIAGNOSIS — E871 Hypo-osmolality and hyponatremia: Secondary | ICD-10-CM | POA: Diagnosis not present

## 2021-01-24 NOTE — Telephone Encounter (Signed)
[place] Name: [facility] Agency Name: Santina [facility] Phone #: [phone] Service Requested: PT Frequency of Visits: 2 week 4  Patient also not taking olmesartan (BENICAR) 20 MG tablet & citalopram (CELEXA) 10 MG tablet  Wanted to make provider aware

## 2021-01-25 DIAGNOSIS — R7401 Elevation of levels of liver transaminase levels: Secondary | ICD-10-CM | POA: Diagnosis not present

## 2021-01-25 DIAGNOSIS — I10 Essential (primary) hypertension: Secondary | ICD-10-CM | POA: Diagnosis not present

## 2021-01-25 DIAGNOSIS — U071 COVID-19: Secondary | ICD-10-CM | POA: Diagnosis not present

## 2021-01-25 DIAGNOSIS — M6282 Rhabdomyolysis: Secondary | ICD-10-CM | POA: Diagnosis not present

## 2021-01-25 DIAGNOSIS — J45909 Unspecified asthma, uncomplicated: Secondary | ICD-10-CM | POA: Diagnosis not present

## 2021-01-25 DIAGNOSIS — M1711 Unilateral primary osteoarthritis, right knee: Secondary | ICD-10-CM | POA: Diagnosis not present

## 2021-01-25 DIAGNOSIS — E871 Hypo-osmolality and hyponatremia: Secondary | ICD-10-CM | POA: Diagnosis not present

## 2021-01-25 DIAGNOSIS — E860 Dehydration: Secondary | ICD-10-CM | POA: Diagnosis not present

## 2021-01-25 DIAGNOSIS — F419 Anxiety disorder, unspecified: Secondary | ICD-10-CM | POA: Diagnosis not present

## 2021-01-26 ENCOUNTER — Encounter: Payer: Self-pay | Admitting: Internal Medicine

## 2021-01-26 ENCOUNTER — Other Ambulatory Visit: Payer: Self-pay

## 2021-01-26 ENCOUNTER — Ambulatory Visit: Payer: Medicare PPO | Admitting: Internal Medicine

## 2021-01-26 VITALS — BP 186/70 | HR 80 | Temp 98.0°F | Ht 62.0 in | Wt 153.0 lb

## 2021-01-26 DIAGNOSIS — E876 Hypokalemia: Secondary | ICD-10-CM | POA: Diagnosis not present

## 2021-01-26 DIAGNOSIS — M6282 Rhabdomyolysis: Secondary | ICD-10-CM | POA: Diagnosis not present

## 2021-01-26 DIAGNOSIS — M170 Bilateral primary osteoarthritis of knee: Secondary | ICD-10-CM | POA: Diagnosis not present

## 2021-01-26 DIAGNOSIS — I10 Essential (primary) hypertension: Secondary | ICD-10-CM | POA: Diagnosis not present

## 2021-01-26 DIAGNOSIS — F4321 Adjustment disorder with depressed mood: Secondary | ICD-10-CM

## 2021-01-26 DIAGNOSIS — U071 COVID-19: Secondary | ICD-10-CM

## 2021-01-26 DIAGNOSIS — Z23 Encounter for immunization: Secondary | ICD-10-CM | POA: Diagnosis not present

## 2021-01-26 LAB — CBC WITH DIFFERENTIAL/PLATELET
Basophils Absolute: 0.1 10*3/uL (ref 0.0–0.1)
Basophils Relative: 0.8 % (ref 0.0–3.0)
Eosinophils Absolute: 0.3 10*3/uL (ref 0.0–0.7)
Eosinophils Relative: 3.2 % (ref 0.0–5.0)
HCT: 37.8 % (ref 36.0–46.0)
Hemoglobin: 12.7 g/dL (ref 12.0–15.0)
Lymphocytes Relative: 20.5 % (ref 12.0–46.0)
Lymphs Abs: 1.8 10*3/uL (ref 0.7–4.0)
MCHC: 33.6 g/dL (ref 30.0–36.0)
MCV: 93.2 fl (ref 78.0–100.0)
Monocytes Absolute: 0.8 10*3/uL (ref 0.1–1.0)
Monocytes Relative: 9.3 % (ref 3.0–12.0)
Neutro Abs: 5.8 10*3/uL (ref 1.4–7.7)
Neutrophils Relative %: 66.2 % (ref 43.0–77.0)
Platelets: 394 10*3/uL (ref 150.0–400.0)
RBC: 4.05 Mil/uL (ref 3.87–5.11)
RDW: 14.2 % (ref 11.5–15.5)
WBC: 8.7 10*3/uL (ref 4.0–10.5)

## 2021-01-26 LAB — BASIC METABOLIC PANEL
BUN: 14 mg/dL (ref 6–23)
CO2: 29 mEq/L (ref 19–32)
Calcium: 9.4 mg/dL (ref 8.4–10.5)
Chloride: 100 mEq/L (ref 96–112)
Creatinine, Ser: 0.9 mg/dL (ref 0.40–1.20)
GFR: 61.38 mL/min (ref >60.00)
Glucose, Bld: 94 mg/dL (ref 70–99)
Potassium: 4 mEq/L (ref 3.5–5.1)
Sodium: 138 mEq/L (ref 135–145)

## 2021-01-26 NOTE — Telephone Encounter (Signed)
Ok for verbals  And we are aware of the medication non use, thanks

## 2021-01-26 NOTE — Progress Notes (Signed)
Patient ID: Kristen Mendoza, female   DOB: 05/13/1940, 80 y.o.   MRN: [id number]        Chief Complaint: follow up HTN, HLD and hyperglycemia, grief and anxiety, worsening bilateral knee pain and swelling       HPI:  Kristen Mendoza is a 80 y.o. female here with c/o worsening bilaeral knee pain due to DJD, has been putting off ortho f/u after seen several years ago, does not want shots or surgury but now pain is getting too much, no falls post hospital or giveaways, pain now 7/10 much of the time worse to walk, better to sit. Was able to graduate with PT from walker to currently cane use only.   Also with increased stress recently after husband and sisterin in law died at different reasons/times, but Denies worsening depressive symptoms, suicidal ideation, or panic; has ongoing anxiety, and pain seems worse with this as well.  Here also after recent hospn with rhabdo episode, now taking better po at home  BP at home has been < 140/90.   Due for flu shot  Pt denies chest pain, increased sob or doe, wheezing, orthopnea, PND, increased LE swelling, palpitations, dizziness or syncope.  Finished paxlovid for recent covid infection.  Will need f/u lab for recent low sodium and K as well.  No other new complaints       Wt Readings from Last 3 Encounters:  01/26/21 153 lb (69.4 kg)  01/10/21 161 lb 2.5 oz (73.1 kg)  09/30/19 161 lb 3.2 oz (73.1 kg)   BP Readings from Last 3 Encounters:  01/26/21 (!) 186/70  01/12/21 (!) 157/56  09/30/19 (!) 176/92         Past Medical History:  Diagnosis Date   Allergic rhinitis 04/19/2010   Overview:  Allergic Rhinitis  10/1 IMO update   Anxiety state 07/21/2010   Overview:  Anxiety Disorder NOS  10/1 IMO update   Arthritis    right knee   Asthma    dx'd years ago but no current issues    Cancer ([place])    endometrial cancer-14 yeras ago- surgery only   Hypertension    Vitamin D deficiency 07/21/2010   Overview:  Vitamin D Deficiency  10/1 IMO update   Past Surgical  History:  Procedure Laterality Date   ABDOMINAL HYSTERECTOMY     BLADDER SURGERY     bladder tack    COLONOSCOPY  11-25-2008   polyps, tics, hems- TA x 1-[place]    knee cap dislocation     40+ years ago   POLYPECTOMY      reports that she has never smoked. She has never used smokeless tobacco. She reports that she does not drink alcohol and does not use drugs. family history is not on file. No Known Allergies Current Outpatient Medications on File Prior to Visit  Medication Sig Dispense Refill   acetaminophen (TYLENOL) 650 MG CR tablet Take 650 mg by mouth every 8 (eight) hours as needed for pain or fever.     hydrALAZINE (APRESOLINE) 25 MG tablet Take 1 tablet (25 mg total) by mouth 3 (three) times daily. 90 tablet 0   traZODone (DESYREL) 50 MG tablet Take 0.5-1 tablets (25-50 mg total) by mouth at bedtime as needed for sleep. (Patient not taking: Reported on 01/26/2021) 90 tablet 1   No current facility-administered medications on file prior to visit.        ROS:  All others reviewed and negative.  Objective  PE:  BP (!) 186/70 (BP Location: Right Arm, Patient Position: Sitting, Cuff Size: Normal)   Pulse 80   Temp 98 F (36.7 C) (Oral)   Ht 5\' 2"  (1.575 m)   Wt 153 lb (69.4 kg)   SpO2 97%   BMI 27.98 kg/m                 Constitutional: Pt appears in NAD               HENT: Head: NCAT.                Right Ear: External ear normal.                 Left Ear: External ear normal.                Eyes: . Pupils are equal, round, and reactive to light. Conjunctivae and EOM are normal               Nose: without d/c or deformity               Neck: Neck supple. Gross normal ROM               Cardiovascular: Normal rate and regular rhythm.                 Pulmonary/Chest: Effort normal and breath sounds without rales or wheezing.                Abd:  Soft, NT, ND, + BS, no organomegaly               Neurological: Pt is alert. At baseline orientation, motor grossly  intact               Skin: Skin is warm. No rashes, no other new lesions, LE edema - none               Psychiatric: Pt behavior is normal without agitation   Micro: none  Cardiac tracings I have personally interpreted today:  none  Pertinent Radiological findings (summarize): none   Lab Results  Component Value Date   WBC 8.7 01/26/2021   HGB 12.7 01/26/2021   HCT 37.8 01/26/2021   PLT 394.0 01/26/2021   GLUCOSE 94 01/26/2021   CHOL 191 08/13/2019   TRIG 95.0 08/13/2019   HDL 65.30 08/13/2019   LDLCALC 107 (H) 08/13/2019   ALT 46 (H) 01/10/2021   AST 147 (H) 01/10/2021   NA 138 01/26/2021   K 4.0 01/26/2021   CL 100 01/26/2021   CREATININE 0.90 01/26/2021   BUN 14 01/26/2021   CO2 29 01/26/2021   TSH 2.55 08/13/2019   Assessment/Plan:  Kristen Mendoza is a 80 y.o. White or Caucasian [1] female with  has a past medical history of Allergic rhinitis (04/19/2010), Anxiety state (07/21/2010), Arthritis, Asthma, Cancer ([place]), Hypertension, and Vitamin D deficiency (07/21/2010).  Rhabdomyolysis due to COVID-19 Likely due to dehydration, poor po intake, cont to follow  Hypertension BP Readings from Last 3 Encounters:  01/26/21 (!) 186/70  01/12/21 (!) 157/56  09/30/19 (!) 176/92   Severe uncontrolled here, pt declines med change as BP ok at home per pt, pt to continue medical treatment hydralazine 25   Grief D/w pt - declines specific med tx or referral counseling at this time  Hypokalemia Also for f/u lab today  COVID-19 Recent infection s/o paxlovid, now denies further symptoms,  to f/u any worsening symptoms  or concerns  Primary osteoarthritis of both knees D/w , prefers referral to [place],  to f/u any worsening symptoms or concerns  Followup: Return in about 6 months (around 07/26/2021).  Cathlean Cower, MD 01/29/2021 4:01 PM [facility]

## 2021-01-26 NOTE — Patient Instructions (Addendum)
Ok to continue to monitor your BP at home as you do  Please continue all other medications as before, and refills have been done if requested.  Please have the pharmacy call with any other refills you may need.  Please continue your efforts at being more active, low cholesterol diet, and weight control.  Please keep your appointments with your specialists as you may have planned  You will be contacted regarding the referral for: orthopedic  Please go to the LAB at the blood drawing area for the tests to be done  You will be contacted by phone if any changes need to be made immediately.  Otherwise, you will receive a letter about your results with an explanation, but please check with MyChart first.  Please remember to sign up for MyChart if you have not done so, as this will be important to you in the future with finding out test results, communicating by private email, and scheduling acute appointments online when needed.  Please make an Appointment to return in 6 months, or sooner if needed

## 2021-01-27 DIAGNOSIS — F419 Anxiety disorder, unspecified: Secondary | ICD-10-CM | POA: Diagnosis not present

## 2021-01-27 DIAGNOSIS — I10 Essential (primary) hypertension: Secondary | ICD-10-CM | POA: Diagnosis not present

## 2021-01-27 DIAGNOSIS — E871 Hypo-osmolality and hyponatremia: Secondary | ICD-10-CM | POA: Diagnosis not present

## 2021-01-27 DIAGNOSIS — M6282 Rhabdomyolysis: Secondary | ICD-10-CM | POA: Diagnosis not present

## 2021-01-27 DIAGNOSIS — E860 Dehydration: Secondary | ICD-10-CM | POA: Diagnosis not present

## 2021-01-27 DIAGNOSIS — U071 COVID-19: Secondary | ICD-10-CM | POA: Diagnosis not present

## 2021-01-27 DIAGNOSIS — R7401 Elevation of levels of liver transaminase levels: Secondary | ICD-10-CM | POA: Diagnosis not present

## 2021-01-27 DIAGNOSIS — M1711 Unilateral primary osteoarthritis, right knee: Secondary | ICD-10-CM | POA: Diagnosis not present

## 2021-01-27 DIAGNOSIS — J45909 Unspecified asthma, uncomplicated: Secondary | ICD-10-CM | POA: Diagnosis not present

## 2021-01-27 NOTE — Telephone Encounter (Signed)
Spoke with Roselie Awkward and was able to give [place] Requested: PT Frequency of Visits: 2 week 4

## 2021-01-29 ENCOUNTER — Encounter: Payer: Self-pay | Admitting: Internal Medicine

## 2021-01-29 DIAGNOSIS — M170 Bilateral primary osteoarthritis of knee: Secondary | ICD-10-CM | POA: Insufficient documentation

## 2021-01-29 NOTE — Assessment & Plan Note (Signed)
Recent infection s/o paxlovid, now denies further symptoms,  to f/u any worsening symptoms or concerns

## 2021-01-29 NOTE — Assessment & Plan Note (Signed)
Also for f/u lab today 

## 2021-01-29 NOTE — Assessment & Plan Note (Signed)
D/w , prefers [facility],  to f/u any worsening symptoms or concerns

## 2021-01-29 NOTE — Assessment & Plan Note (Signed)
D/w pt - declines specific med tx or referral counseling at this time

## 2021-01-29 NOTE — Assessment & Plan Note (Signed)
Likely due to dehydration, poor po intake, cont to follow

## 2021-01-29 NOTE — Assessment & Plan Note (Signed)
BP Readings from Last 3 Encounters:  01/26/21 (!) 186/70  01/12/21 (!) 157/56  09/30/19 (!) 176/92   Severe uncontrolled here, pt declines med change as BP ok at home per pt, pt to continue medical treatment hydralazine 25

## 2021-01-31 DIAGNOSIS — U071 COVID-19: Secondary | ICD-10-CM | POA: Diagnosis not present

## 2021-01-31 DIAGNOSIS — E871 Hypo-osmolality and hyponatremia: Secondary | ICD-10-CM | POA: Diagnosis not present

## 2021-01-31 DIAGNOSIS — M6282 Rhabdomyolysis: Secondary | ICD-10-CM | POA: Diagnosis not present

## 2021-01-31 DIAGNOSIS — M1711 Unilateral primary osteoarthritis, right knee: Secondary | ICD-10-CM | POA: Diagnosis not present

## 2021-01-31 DIAGNOSIS — F419 Anxiety disorder, unspecified: Secondary | ICD-10-CM | POA: Diagnosis not present

## 2021-01-31 DIAGNOSIS — E860 Dehydration: Secondary | ICD-10-CM | POA: Diagnosis not present

## 2021-01-31 DIAGNOSIS — J45909 Unspecified asthma, uncomplicated: Secondary | ICD-10-CM | POA: Diagnosis not present

## 2021-01-31 DIAGNOSIS — R7401 Elevation of levels of liver transaminase levels: Secondary | ICD-10-CM | POA: Diagnosis not present

## 2021-01-31 DIAGNOSIS — I10 Essential (primary) hypertension: Secondary | ICD-10-CM | POA: Diagnosis not present

## 2021-02-03 DIAGNOSIS — E871 Hypo-osmolality and hyponatremia: Secondary | ICD-10-CM | POA: Diagnosis not present

## 2021-02-03 DIAGNOSIS — M6282 Rhabdomyolysis: Secondary | ICD-10-CM | POA: Diagnosis not present

## 2021-02-03 DIAGNOSIS — E860 Dehydration: Secondary | ICD-10-CM | POA: Diagnosis not present

## 2021-02-03 DIAGNOSIS — M1711 Unilateral primary osteoarthritis, right knee: Secondary | ICD-10-CM | POA: Diagnosis not present

## 2021-02-03 DIAGNOSIS — I10 Essential (primary) hypertension: Secondary | ICD-10-CM | POA: Diagnosis not present

## 2021-02-03 DIAGNOSIS — F419 Anxiety disorder, unspecified: Secondary | ICD-10-CM | POA: Diagnosis not present

## 2021-02-03 DIAGNOSIS — J45909 Unspecified asthma, uncomplicated: Secondary | ICD-10-CM | POA: Diagnosis not present

## 2021-02-03 DIAGNOSIS — R7401 Elevation of levels of liver transaminase levels: Secondary | ICD-10-CM | POA: Diagnosis not present

## 2021-02-03 DIAGNOSIS — U071 COVID-19: Secondary | ICD-10-CM | POA: Diagnosis not present

## 2021-02-06 DIAGNOSIS — I10 Essential (primary) hypertension: Secondary | ICD-10-CM | POA: Diagnosis not present

## 2021-02-06 DIAGNOSIS — E860 Dehydration: Secondary | ICD-10-CM | POA: Diagnosis not present

## 2021-02-06 DIAGNOSIS — J45909 Unspecified asthma, uncomplicated: Secondary | ICD-10-CM | POA: Diagnosis not present

## 2021-02-06 DIAGNOSIS — R7401 Elevation of levels of liver transaminase levels: Secondary | ICD-10-CM | POA: Diagnosis not present

## 2021-02-06 DIAGNOSIS — U071 COVID-19: Secondary | ICD-10-CM | POA: Diagnosis not present

## 2021-02-06 DIAGNOSIS — F419 Anxiety disorder, unspecified: Secondary | ICD-10-CM | POA: Diagnosis not present

## 2021-02-06 DIAGNOSIS — E871 Hypo-osmolality and hyponatremia: Secondary | ICD-10-CM | POA: Diagnosis not present

## 2021-02-06 DIAGNOSIS — M1711 Unilateral primary osteoarthritis, right knee: Secondary | ICD-10-CM | POA: Diagnosis not present

## 2021-02-06 DIAGNOSIS — M6282 Rhabdomyolysis: Secondary | ICD-10-CM | POA: Diagnosis not present

## 2021-02-07 ENCOUNTER — Other Ambulatory Visit: Payer: Self-pay

## 2021-02-07 DIAGNOSIS — I10 Essential (primary) hypertension: Secondary | ICD-10-CM

## 2021-02-07 MED ORDER — HYDRALAZINE HCL 25 MG PO TABS: 25.0000 mg | ORAL_TABLET | Freq: Three times a day (TID) | ORAL | 0 refills | Status: DC

## 2021-02-08 DIAGNOSIS — M1711 Unilateral primary osteoarthritis, right knee: Secondary | ICD-10-CM | POA: Diagnosis not present

## 2021-02-09 DIAGNOSIS — F419 Anxiety disorder, unspecified: Secondary | ICD-10-CM | POA: Diagnosis not present

## 2021-02-09 DIAGNOSIS — M1711 Unilateral primary osteoarthritis, right knee: Secondary | ICD-10-CM | POA: Diagnosis not present

## 2021-02-09 DIAGNOSIS — E860 Dehydration: Secondary | ICD-10-CM | POA: Diagnosis not present

## 2021-02-09 DIAGNOSIS — I10 Essential (primary) hypertension: Secondary | ICD-10-CM | POA: Diagnosis not present

## 2021-02-09 DIAGNOSIS — M6282 Rhabdomyolysis: Secondary | ICD-10-CM | POA: Diagnosis not present

## 2021-02-09 DIAGNOSIS — E871 Hypo-osmolality and hyponatremia: Secondary | ICD-10-CM | POA: Diagnosis not present

## 2021-02-09 DIAGNOSIS — U071 COVID-19: Secondary | ICD-10-CM | POA: Diagnosis not present

## 2021-02-09 DIAGNOSIS — R7401 Elevation of levels of liver transaminase levels: Secondary | ICD-10-CM | POA: Diagnosis not present

## 2021-02-09 DIAGNOSIS — J45909 Unspecified asthma, uncomplicated: Secondary | ICD-10-CM | POA: Diagnosis not present

## 2021-02-13 DIAGNOSIS — E860 Dehydration: Secondary | ICD-10-CM | POA: Diagnosis not present

## 2021-02-13 DIAGNOSIS — J45909 Unspecified asthma, uncomplicated: Secondary | ICD-10-CM | POA: Diagnosis not present

## 2021-02-13 DIAGNOSIS — I10 Essential (primary) hypertension: Secondary | ICD-10-CM | POA: Diagnosis not present

## 2021-02-13 DIAGNOSIS — E871 Hypo-osmolality and hyponatremia: Secondary | ICD-10-CM | POA: Diagnosis not present

## 2021-02-13 DIAGNOSIS — U071 COVID-19: Secondary | ICD-10-CM | POA: Diagnosis not present

## 2021-02-13 DIAGNOSIS — M1711 Unilateral primary osteoarthritis, right knee: Secondary | ICD-10-CM | POA: Diagnosis not present

## 2021-02-13 DIAGNOSIS — F419 Anxiety disorder, unspecified: Secondary | ICD-10-CM | POA: Diagnosis not present

## 2021-02-13 DIAGNOSIS — M6282 Rhabdomyolysis: Secondary | ICD-10-CM | POA: Diagnosis not present

## 2021-02-13 DIAGNOSIS — R7401 Elevation of levels of liver transaminase levels: Secondary | ICD-10-CM | POA: Diagnosis not present

## 2021-02-15 DIAGNOSIS — J45909 Unspecified asthma, uncomplicated: Secondary | ICD-10-CM | POA: Diagnosis not present

## 2021-02-15 DIAGNOSIS — E871 Hypo-osmolality and hyponatremia: Secondary | ICD-10-CM | POA: Diagnosis not present

## 2021-02-15 DIAGNOSIS — U071 COVID-19: Secondary | ICD-10-CM | POA: Diagnosis not present

## 2021-02-15 DIAGNOSIS — M6282 Rhabdomyolysis: Secondary | ICD-10-CM | POA: Diagnosis not present

## 2021-02-15 DIAGNOSIS — E860 Dehydration: Secondary | ICD-10-CM | POA: Diagnosis not present

## 2021-02-15 DIAGNOSIS — R7401 Elevation of levels of liver transaminase levels: Secondary | ICD-10-CM | POA: Diagnosis not present

## 2021-02-15 DIAGNOSIS — M1711 Unilateral primary osteoarthritis, right knee: Secondary | ICD-10-CM | POA: Diagnosis not present

## 2021-02-15 DIAGNOSIS — I10 Essential (primary) hypertension: Secondary | ICD-10-CM | POA: Diagnosis not present

## 2021-02-15 DIAGNOSIS — F419 Anxiety disorder, unspecified: Secondary | ICD-10-CM | POA: Diagnosis not present

## 2021-03-04 ENCOUNTER — Other Ambulatory Visit: Payer: Self-pay | Admitting: Internal Medicine

## 2021-03-04 DIAGNOSIS — I10 Essential (primary) hypertension: Secondary | ICD-10-CM

## 2021-03-04 NOTE — Telephone Encounter (Signed)
Please refill as per office routine med refill policy (all routine meds to be refilled for 3 mo or monthly (per pt preference) up to one year from last visit, then month to month grace period for 3 mo, then further med refills will have to be denied) ? ?

## 2021-03-22 DIAGNOSIS — M25561 Pain in right knee: Secondary | ICD-10-CM | POA: Diagnosis not present

## 2021-04-03 ENCOUNTER — Other Ambulatory Visit: Payer: Self-pay | Admitting: Internal Medicine

## 2021-04-03 DIAGNOSIS — I10 Essential (primary) hypertension: Secondary | ICD-10-CM

## 2021-04-03 NOTE — Telephone Encounter (Signed)
Please refill as per office routine med refill policy (all routine meds to be refilled for 3 mo or monthly (per pt preference) up to one year from last visit, then month to month grace period for 3 mo, then further med refills will have to be denied) ? ?

## 2021-04-20 ENCOUNTER — Encounter: Payer: Self-pay | Admitting: Internal Medicine

## 2021-05-09 ENCOUNTER — Ambulatory Visit: Payer: Medicare PPO | Admitting: Internal Medicine

## 2021-05-24 ENCOUNTER — Other Ambulatory Visit: Payer: Self-pay

## 2021-05-24 ENCOUNTER — Encounter (HOSPITAL_BASED_OUTPATIENT_CLINIC_OR_DEPARTMENT_OTHER): Payer: Self-pay

## 2021-05-24 ENCOUNTER — Ambulatory Visit (HOSPITAL_BASED_OUTPATIENT_CLINIC_OR_DEPARTMENT_OTHER)
Admission: RE | Admit: 2021-05-24 | Discharge: 2021-05-24 | Disposition: A | Discharge status: Home / Self Care | Payer: Medicare PPO | Source: Ambulatory Visit | Attending: Internal Medicine | Admitting: Internal Medicine

## 2021-05-24 ENCOUNTER — Other Ambulatory Visit (HOSPITAL_BASED_OUTPATIENT_CLINIC_OR_DEPARTMENT_OTHER): Payer: Self-pay | Admitting: Internal Medicine

## 2021-05-24 DIAGNOSIS — Z1231 Encounter for screening mammogram for malignant neoplasm of breast: Secondary | ICD-10-CM | POA: Insufficient documentation

## 2021-05-28 ENCOUNTER — Other Ambulatory Visit: Payer: Self-pay | Admitting: Internal Medicine

## 2021-05-28 DIAGNOSIS — I10 Essential (primary) hypertension: Secondary | ICD-10-CM

## 2021-05-28 NOTE — Telephone Encounter (Signed)
Please refill as per office routine med refill policy (all routine meds to be refilled for 3 mo or monthly (per pt preference) up to one year from last visit, then month to month grace period for 3 mo, then further med refills will have to be denied) ? ?

## 2021-06-13 ENCOUNTER — Other Ambulatory Visit: Payer: Self-pay | Admitting: Internal Medicine

## 2021-06-13 DIAGNOSIS — I10 Essential (primary) hypertension: Secondary | ICD-10-CM

## 2021-06-13 NOTE — Telephone Encounter (Signed)
Please refill as per office routine med refill policy (all routine meds to be refilled for 3 mo or monthly (per pt preference) up to one year from last visit, then month to month grace period for 3 mo, then further med refills will have to be denied) ? ?

## 2021-06-14 ENCOUNTER — Telehealth: Payer: Self-pay

## 2021-06-14 NOTE — Telephone Encounter (Signed)
Pt daughter calling on behave of pt for refill request on: hydrALAZINE (APRESOLINE) 25 MG tablet  [id number] [place] [place]  LOV: 01/26/21  Pt daughter CB [phone]

## 2021-06-14 NOTE — Telephone Encounter (Signed)
Prescription was sent to pharmacy; patient's daughter notified via voicemail

## 2021-07-05 DIAGNOSIS — Z961 Presence of intraocular lens: Secondary | ICD-10-CM | POA: Diagnosis not present

## 2021-07-05 DIAGNOSIS — H524 Presbyopia: Secondary | ICD-10-CM | POA: Diagnosis not present

## 2021-07-13 ENCOUNTER — Telehealth: Payer: Self-pay | Admitting: Internal Medicine

## 2021-07-13 DIAGNOSIS — I10 Essential (primary) hypertension: Secondary | ICD-10-CM

## 2021-07-13 NOTE — Telephone Encounter (Signed)
1.Medication Requested: hydrALAZINE (APRESOLINE) 25 MG tablet ? ?2. Pharmacy (Name, Street, [place]):  [id number] [place] [place]  [phone] ?Fax:  [phone] ?  ? ? ?3. On Med List: yes ? ?4. Last Visit with PCP: 09.22.22 ? ?5. Next visit date with PCP: 03.23.23 ? ? ?Agent: Please be advised that RX refills may take up to 3 business days. We ask that you follow-up with your pharmacy.  ?

## 2021-07-14 MED ORDER — HYDRALAZINE HCL 25 MG PO TABS: 25.0000 mg | ORAL_TABLET | Freq: Three times a day (TID) | ORAL | 0 refills | Status: DC

## 2021-07-27 ENCOUNTER — Ambulatory Visit: Payer: Medicare PPO | Admitting: Internal Medicine

## 2021-08-04 ENCOUNTER — Ambulatory Visit: Payer: Medicare PPO | Admitting: Internal Medicine

## 2021-08-04 ENCOUNTER — Encounter: Payer: Self-pay | Admitting: Internal Medicine

## 2021-08-04 VITALS — BP 158/90 | HR 90 | Temp 98.1°F | Ht 62.0 in | Wt 163.2 lb

## 2021-08-04 DIAGNOSIS — Z0001 Encounter for general adult medical examination with abnormal findings: Secondary | ICD-10-CM | POA: Diagnosis not present

## 2021-08-04 DIAGNOSIS — F411 Generalized anxiety disorder: Secondary | ICD-10-CM | POA: Diagnosis not present

## 2021-08-04 DIAGNOSIS — R739 Hyperglycemia, unspecified: Secondary | ICD-10-CM | POA: Diagnosis not present

## 2021-08-04 DIAGNOSIS — E559 Vitamin D deficiency, unspecified: Secondary | ICD-10-CM | POA: Diagnosis not present

## 2021-08-04 DIAGNOSIS — I10 Essential (primary) hypertension: Secondary | ICD-10-CM | POA: Diagnosis not present

## 2021-08-04 DIAGNOSIS — E538 Deficiency of other specified B group vitamins: Secondary | ICD-10-CM | POA: Diagnosis not present

## 2021-08-04 DIAGNOSIS — J4520 Mild intermittent asthma, uncomplicated: Secondary | ICD-10-CM

## 2021-08-04 LAB — CBC WITH DIFFERENTIAL/PLATELET
Basophils Absolute: 0.1 10*3/uL (ref 0.0–0.1)
Basophils Relative: 1 % (ref 0.0–3.0)
Eosinophils Absolute: 0.1 10*3/uL (ref 0.0–0.7)
Eosinophils Relative: 0.8 % (ref 0.0–5.0)
HCT: 41.7 % (ref 36.0–46.0)
Hemoglobin: 13.8 g/dL (ref 12.0–15.0)
Lymphocytes Relative: 25.3 % (ref 12.0–46.0)
Lymphs Abs: 2.2 10*3/uL (ref 0.7–4.0)
MCHC: 33 g/dL (ref 30.0–36.0)
MCV: 93.4 fl (ref 78.0–100.0)
Monocytes Absolute: 0.9 10*3/uL (ref 0.1–1.0)
Monocytes Relative: 9.7 % (ref 3.0–12.0)
Neutro Abs: 5.6 10*3/uL (ref 1.4–7.7)
Neutrophils Relative %: 63.2 % (ref 43.0–77.0)
Platelets: 297 10*3/uL (ref 150.0–400.0)
RBC: 4.47 Mil/uL (ref 3.87–5.11)
RDW: 14.1 % (ref 11.5–15.5)
WBC: 8.8 10*3/uL (ref 4.0–10.5)

## 2021-08-04 LAB — URINALYSIS, ROUTINE W REFLEX MICROSCOPIC
Bilirubin Urine: NEGATIVE
Hgb urine dipstick: NEGATIVE
Ketones, ur: NEGATIVE
Leukocytes,Ua: NEGATIVE
Nitrite: NEGATIVE
Specific Gravity, Urine: <=1.005 — AB (ref 1.000–1.030)
Total Protein, Urine: NEGATIVE
Urine Glucose: NEGATIVE
Urobilinogen, UA: 0.2 (ref 0.0–1.0)
pH: 5.5 (ref 5.0–8.0)

## 2021-08-04 LAB — LIPID PANEL
Cholesterol: 186 mg/dL (ref 0–200)
HDL: 72.9 mg/dL (ref >39.00)
LDL Cholesterol: 100 mg/dL — ABNORMAL HIGH (ref 0–99)
NonHDL: 112.66
Total CHOL/HDL Ratio: 3
Triglycerides: 63 mg/dL (ref 0.0–149.0)
VLDL: 12.6 mg/dL (ref 0.0–40.0)

## 2021-08-04 LAB — TSH: TSH: 3.88 u[IU]/mL (ref 0.35–5.50)

## 2021-08-04 LAB — HEPATIC FUNCTION PANEL
ALT: 16 U/L (ref 0–35)
AST: 20 U/L (ref 0–37)
Albumin: 4.6 g/dL (ref 3.5–5.2)
Alkaline Phosphatase: 61 U/L (ref 39–117)
Bilirubin, Direct: 0.2 mg/dL (ref 0.0–0.3)
Total Bilirubin: 1 mg/dL (ref 0.2–1.2)
Total Protein: 7.5 g/dL (ref 6.0–8.3)

## 2021-08-04 LAB — HEMOGLOBIN A1C: Hgb A1c MFr Bld: 5.7 % (ref 4.6–6.5)

## 2021-08-04 LAB — VITAMIN B12: Vitamin B-12: 527 pg/mL (ref 211–911)

## 2021-08-04 LAB — BASIC METABOLIC PANEL
BUN: 22 mg/dL (ref 6–23)
CO2: 27 mEq/L (ref 19–32)
Calcium: 9.8 mg/dL (ref 8.4–10.5)
Chloride: 103 mEq/L (ref 96–112)
Creatinine, Ser: 0.79 mg/dL (ref 0.40–1.20)
GFR: 71.52 mL/min (ref >60.00)
Glucose, Bld: 92 mg/dL (ref 70–99)
Potassium: 3.7 mEq/L (ref 3.5–5.1)
Sodium: 138 mEq/L (ref 135–145)

## 2021-08-04 LAB — VITAMIN D 25 HYDROXY (VIT D DEFICIENCY, FRACTURES): VITD: 25.02 ng/mL — ABNORMAL LOW (ref 30.00–100.00)

## 2021-08-04 MED ORDER — IRBESARTAN 150 MG PO TABS: 150.0000 mg | ORAL_TABLET | Freq: Every day | ORAL | 3 refills | Status: DC

## 2021-08-04 NOTE — Assessment & Plan Note (Signed)
Last vitamin D ?Lab Results  ?Component Value Date  ? VD25OH 25.02 (L) 08/04/2021  ? ?Low, to start oral replacement ? ?

## 2021-08-04 NOTE — Patient Instructions (Signed)
Ok to stop the hydralazine ? ?Please take all new medication as prescribed - the generic avapro ? ?Please continue to monitor the BP at home ? ?Please continue all other medications as before, and refills have been done if requested. ? ?Please have the pharmacy call with any other refills you may need. ? ?Please continue your efforts at being more active, low cholesterol diet, and weight control. ? ?You are otherwise up to date with prevention measures today. ? ?Please keep your appointments with your specialists as you may have planned ? ?Please go to the LAB at the blood drawing area for the tests to be done ? ?You will be contacted by phone if any changes need to be made immediately.  Otherwise, you will receive a letter about your results with an explanation, but please check with MyChart first. ? ?Please remember to sign up for MyChart if you have not done so, as this will be important to you in the future with finding out test results, communicating by private email, and scheduling acute appointments online when needed. ? ?Please make an Appointment to return in 6 months, or sooner if needed ?

## 2021-08-04 NOTE — Progress Notes (Addendum)
Patient ID: Kristen Mendoza, female   DOB: October 11, 1940, 81 y.o.   MRN: [id number] ? ? ? ?     Chief Complaint:: wellness exam and htn,  low vit d, asthma, anxiety ? ?     HPI:  Kristen Mendoza is a 81 y.o. female here for wellness exam here with daughter; declines shingrix, tdap, hep c screen, o/w up to date ?         ?              Also Pt denies chest pain, increased sob or doe, wheezing, orthopnea, PND, increased LE swelling, palpitations, dizziness or syncope.   Pt denies polydipsia, polyuria, or new focal neuro s/s.    Pt denies fever, wt loss, night sweats, loss of appetite, or other constitutional symptoms  Asks to change her tid antiHTN med to another daily oral  No Not taking vit d.  Denies worsening depressive symptoms, suicidal ideation, or panic; ?  ?Wt Readings from Last 3 Encounters:  ?08/04/21 163 lb 3.2 oz (74 kg)  ?01/26/21 153 lb (69.4 kg)  ?01/10/21 161 lb 2.5 oz (73.1 kg)  ? ?BP Readings from Last 3 Encounters:  ?08/04/21 (!) 158/90  ?01/26/21 (!) 186/70  ?01/12/21 (!) 157/56  ? ?Immunization History  ?Administered Date(s) Administered  ? Fluad Quad(high Dose 65+) 01/26/2021  ? Influenza, High Dose Seasonal PF 02/09/2019, 01/22/2020  ? PFIZER(Purple Top)SARS-COV-2 Vaccination 06/19/2019, 07/12/2019  ? Pneumococcal Conjugate-13 08/13/2019  ? Pneumococcal Polysaccharide-23 10/05/2009  ? Tdap 10/05/2009  ?There are no preventive care reminders to display for this patient. ?  ? ?Past Medical History:  ?Diagnosis Date  ? Allergic rhinitis 04/19/2010  ? Overview:  Allergic Rhinitis  10/1 IMO update  ? Anxiety state 07/21/2010  ? Overview:  Anxiety Disorder NOS  10/1 IMO update  ? Arthritis   ? right knee  ? Asthma   ? dx'd years ago but no current issues   ? Cancer  [facility])   ? endometrial cancer-14 yeras ago- surgery only  ? Hypertension   ? Vitamin D deficiency 07/21/2010  ? Overview:  Vitamin D Deficiency  10/1 IMO update  ? ?Past Surgical History:  ?Procedure Laterality Date  ? ABDOMINAL HYSTERECTOMY    ?  BLADDER SURGERY    ? bladder tack   ? COLONOSCOPY  11-25-2008  ? polyps, tics, hems- TA x 1-perry   ? knee cap dislocation    ? 40+ years ago  ? POLYPECTOMY    ? ? reports that she has never smoked. She has never used smokeless tobacco. She reports that she does not drink alcohol and does not use drugs. ?family history is not on file. ?No Known Allergies ?Current Outpatient Medications on File Prior to Visit  ?Medication Sig Dispense Refill  ? acetaminophen (TYLENOL) 650 MG CR tablet Take 650 mg by mouth every 8 (eight) hours as needed for pain or fever.    ? ?No current facility-administered medications on file prior to visit.  ? ?     ROS:  All others reviewed and negative. ? ?Objective  ? ?     PE:  BP (!) 158/90 (BP Location: Right Arm, Patient Position: Sitting, Cuff Size: Large)   Pulse 90   Temp 98.1 ?F (36.7 ?C) (Oral)   Ht '5\' 2"'$  (1.575 m)   Wt 163 lb 3.2 oz (74 kg)   SpO2 97%   BMI 29.85 kg/m?  ? ?  Constitutional: Pt appears in NAD ?              HENT: Head: NCAT.  ?              Right Ear: External ear normal.   ?              Left Ear: External ear normal.  ?              Eyes: . Pupils are equal, round, and reactive to light. Conjunctivae and EOM are normal ?              Nose: without d/c or deformity ?              Neck: Neck supple. Gross normal ROM ?              Cardiovascular: Normal rate and regular rhythm.   ?              Pulmonary/Chest: Effort normal and breath sounds without rales or wheezing.  ?              Abd:  Soft, NT, ND, + BS, no organomegaly ?              Neurological: Pt is alert. At baseline orientation, motor grossly intact ?              Skin: Skin is warm. No rashes, no other new lesions, LE edema - none ?              Psychiatric: Pt behavior is normal without agitation  ? ?Micro: none ? ?Cardiac tracings I have personally interpreted today:  none ? ?Pertinent Radiological findings (summarize): none  ? ?Lab Results  ?Component Value Date  ? WBC 8.8 08/04/2021   ? HGB 13.8 08/04/2021  ? HCT 41.7 08/04/2021  ? PLT 297.0 08/04/2021  ? GLUCOSE 92 08/04/2021  ? CHOL 186 08/04/2021  ? TRIG 63.0 08/04/2021  ? HDL 72.90 08/04/2021  ? LDLCALC 100 (H) 08/04/2021  ? ALT 16 08/04/2021  ? AST 20 08/04/2021  ? NA 138 08/04/2021  ? K 3.7 08/04/2021  ? CL 103 08/04/2021  ? CREATININE 0.79 08/04/2021  ? BUN 22 08/04/2021  ? CO2 27 08/04/2021  ? TSH 3.88 08/04/2021  ? HGBA1C 5.7 08/04/2021  ? ?Assessment/Plan:  ?Kristen Mendoza is a 81 y.o. White or Caucasian [1] female with  has a past medical history of Allergic rhinitis (04/19/2010), Anxiety state (07/21/2010), Arthritis, Asthma, Cancer ([place]), Hypertension, and Vitamin D deficiency (07/21/2010). ? ?Vitamin D deficiency ?Last vitamin D ?Lab Results  ?Component Value Date  ? VD25OH 25.02 (L) 08/04/2021  ? ?Low, to start oral replacement ? ? ?Encounter for well adult exam with abnormal findings ?Age and sex appropriate education and counseling updated with regular exercise and diet ?Referrals for preventative services - declines tdap ?Immunizations addressed - declines shingrix, tdap ?Smoking counseling  - none needed ?Evidence for depression or other mood disorder - chronic anxiety stable ?Most recent labs reviewed. ?I have personally reviewed and have noted: ?1) the patient's medical and social history ?2) The patient's current medications and supplements ?3) The patient's height, weight, and BMI have been recorded in the chart ? ? ?Hypertension ?BP Readings from Last 3 Encounters:  ?08/04/21 (!) 158/90  ?01/26/21 (!) 186/70  ?01/12/21 (!) 157/56  ? ?Uncontrolled on hard to accomplish tid med, pt to change to avapro 150 qd ? ? ?Asthma ?Stable, to continue  inhaler prn ? ?Anxiety state ?Stable, declines need for any further change in tx or counseling ? ?Followup: Return in about 6 months (around 02/03/2022). ? ?Cathlean Cower, MD 08/05/2021 4:19 PM ?[place] ?[place] ?Internal Medicine ?

## 2021-08-05 NOTE — Assessment & Plan Note (Signed)
Age and sex appropriate education and counseling updated with regular exercise and diet ?Referrals for preventative services - declines tdap ?Immunizations addressed - declines shingrix, tdap ?Smoking counseling  - none needed ?Evidence for depression or other mood disorder - chronic anxiety stable ?Most recent labs reviewed. ?I have personally reviewed and have noted: ?1) the patient's medical and social history ?2) The patient's current medications and supplements ?3) The patient's height, weight, and BMI have been recorded in the chart ? ?

## 2021-08-05 NOTE — Assessment & Plan Note (Signed)
BP Readings from Last 3 Encounters:  ?08/04/21 (!) 158/90  ?01/26/21 (!) 186/70  ?01/12/21 (!) 157/56  ? ?Uncontrolled on hard to accomplish tid med, pt to change to avapro 150 qd ? ?

## 2021-08-05 NOTE — Assessment & Plan Note (Signed)
Stable, to continue inhaler prn 

## 2021-08-05 NOTE — Assessment & Plan Note (Signed)
Stable, declines need for any further change in tx or counseling ?

## 2021-08-09 ENCOUNTER — Encounter: Payer: Self-pay | Admitting: Internal Medicine

## 2021-08-15 ENCOUNTER — Ambulatory Visit: Payer: Medicare PPO | Admitting: Internal Medicine

## 2021-08-15 ENCOUNTER — Encounter: Payer: Self-pay | Admitting: Internal Medicine

## 2021-08-15 VITALS — BP 218/82 | HR 64 | Temp 97.9°F | Ht 62.0 in | Wt 162.0 lb

## 2021-08-15 DIAGNOSIS — F03A4 Unspecified dementia, mild, with anxiety: Secondary | ICD-10-CM | POA: Diagnosis not present

## 2021-08-15 DIAGNOSIS — I10 Essential (primary) hypertension: Secondary | ICD-10-CM

## 2021-08-15 DIAGNOSIS — W19XXXA Unspecified fall, initial encounter: Secondary | ICD-10-CM | POA: Diagnosis not present

## 2021-08-15 DIAGNOSIS — F411 Generalized anxiety disorder: Secondary | ICD-10-CM | POA: Diagnosis not present

## 2021-08-15 MED ORDER — AMLODIPINE BESYLATE 5 MG PO TABS: 5.0000 mg | ORAL_TABLET | Freq: Every day | ORAL | 3 refills | Status: DC

## 2021-08-15 MED ORDER — OLMESARTAN MEDOXOMIL 40 MG PO TABS: 40.0000 mg | ORAL_TABLET | Freq: Every day | ORAL | 3 refills | Status: DC

## 2021-08-15 NOTE — Progress Notes (Signed)
? ?    Chief Complaint: follow up HTN, recent fall and generalzied weakness ? ?     HPI:  Kristen Mendoza is a 81 y.o. female here with daughter RN, pt found to have increased BP at dental 183.83after recent worsening weakness, fall with chipped tooth, now on amoxil preventive.   Pt denies chest pain, increased sob or doe, wheezing, orthopnea, PND, increased LE swelling, palpitations, dizziness or syncope.   Pt denies polydipsia, polyuria, or new focal neuro s/s.    Pt denies fever, wt loss, night sweats, loss of appetite, or other constitutional symptoms  Has hydralzine at home to use while changing tx.  Pt adamant not going to ED ?      ?Wt Readings from Last 3 Encounters:  ?08/15/21 162 lb (73.5 kg)  ?08/04/21 163 lb 3.2 oz (74 kg)  ?01/26/21 153 lb (69.4 kg)  ? ?BP Readings from Last 3 Encounters:  ?08/15/21 (!) 218/82  ?08/04/21 (!) 158/90  ?01/26/21 (!) 186/70  ? ?      ?Past Medical History:  ?Diagnosis Date  ? Allergic rhinitis 04/19/2010  ? Overview:  Allergic Rhinitis  10/1 IMO update  ? Anxiety state 07/21/2010  ? Overview:  Anxiety Disorder NOS  10/1 IMO update  ? Arthritis   ? right knee  ? Asthma   ? dx'd years ago but no current issues   ? Cancer [facility])   ? endometrial cancer-14 yeras ago- surgery only  ? Hypertension   ? Vitamin D deficiency 07/21/2010  ? Overview:  Vitamin D Deficiency  10/1 IMO update  ? ?Past Surgical History:  ?Procedure Laterality Date  ? ABDOMINAL HYSTERECTOMY    ? BLADDER SURGERY    ? bladder tack   ? COLONOSCOPY  11-25-2008  ? polyps, tics, hems- TA x 1-perry   ? knee cap dislocation    ? 40+ years ago  ? POLYPECTOMY    ? ? reports that she has never smoked. She has never used smokeless tobacco. She reports that she does not drink alcohol and does not use drugs. ?family history is not on file. ?No Known Allergies ?Current Outpatient Medications on File Prior to Visit  ?Medication Sig Dispense Refill  ? acetaminophen (TYLENOL) 650 MG CR tablet Take 650 mg by mouth every 8 (eight)  hours as needed for pain or fever.    ? ?No current facility-administered medications on file prior to visit.  ? ?     ROS:  All others reviewed and negative. ? ?Objective  ? ?     PE:  BP (!) 218/82 (BP Location: Left Arm, Patient Position: Sitting, Cuff Size: Large)   Pulse 64   Temp 97.9 ?F (36.6 ?C) (Oral)   Ht '5\' 2"'$  (1.575 m)   Wt 162 lb (73.5 kg)   SpO2 98%   BMI 29.63 kg/m?  ? ?              Constitutional: Pt appears in NAD ?              HENT: Head: NCAT.  ?              Right Ear: External ear normal.   ?              Left Ear: External ear normal.  ?              Eyes: . Pupils are equal, round, and reactive to light. Conjunctivae and EOM are normal ?  Nose: without d/c or deformity ?              Neck: Neck supple. Gross normal ROM ?              Cardiovascular: Normal rate and regular rhythm.   ?              Pulmonary/Chest: Effort normal and breath sounds without rales or wheezing.  ?              Abd:  Soft, NT, ND, + BS, no organomegaly ?              Neurological: Pt is alert. At baseline orientation, motor grossly intact ?              Skin: Skin is warm. No rashes, no other new lesions, LE edema - none ?              Psychiatric: Pt behavior is normal without agitation  ? ?Micro: none ? ?Cardiac tracings I have personally interpreted today:  none ? ?Pertinent Radiological findings (summarize): none  ? ?Lab Results  ?Component Value Date  ? WBC 8.8 08/04/2021  ? HGB 13.8 08/04/2021  ? HCT 41.7 08/04/2021  ? PLT 297.0 08/04/2021  ? GLUCOSE 92 08/04/2021  ? CHOL 186 08/04/2021  ? TRIG 63.0 08/04/2021  ? HDL 72.90 08/04/2021  ? LDLCALC 100 (H) 08/04/2021  ? ALT 16 08/04/2021  ? AST 20 08/04/2021  ? NA 138 08/04/2021  ? K 3.7 08/04/2021  ? CL 103 08/04/2021  ? CREATININE 0.79 08/04/2021  ? BUN 22 08/04/2021  ? CO2 27 08/04/2021  ? TSH 3.88 08/04/2021  ? HGBA1C 5.7 08/04/2021  ? ?Assessment/Plan:  ?Kristen Mendoza is a 81 y.o. White or Caucasian [1] female with  has a past medical  history of Allergic rhinitis (04/19/2010), Anxiety state (07/21/2010), Arthritis, Asthma, Cancer ([place]), Hypertension, and Vitamin D deficiency (07/21/2010). ? ?Hypertension ?Severe uncontrolled, declines referral to ED, tonight for restart hydralazine 25-50 mg tid, while getting started on benicar 40 and amlodipine 5 new therapies, RN duaghter quite motivated to help; will let us know on mychart how transition is going and overall BP control ? ?Fall ?With worsening gneeralized weakness, for [place] with Pt - arnold per family request ? ?Dementia ([place]) ?Pt with worsening memory loss but very good family support, declines other eval or tx for now, for [facility] as above ? ?Anxiety state ?Overall mild, declines need for change in tx or referral for counseling ? ?Followup: Return in about 4 weeks (around 09/12/2021). ? ?Cathlean Cower, MD 08/20/2021 7:50 PM ?[place] ?[place] ?Internal Medicine ?

## 2021-08-15 NOTE — Patient Instructions (Addendum)
Ok to use the home hydralazine for today at 25 - 50 mg every 6 hrs as needed to get the BP down ? ?Please take all new medication as prescribed - the benicar 40 mg and amlodipine 5 mg  ? ?Please let us know by Mychart if any questions or concerns ? ?Please continue all other medications as before, and refills have been done if requested. ? ?Please have the pharmacy call with any other refills you may need. ? ?Please keep your appointments with your specialists as you may have planned ? ?Please make an Appointment to return for your 1 month ?

## 2021-08-16 ENCOUNTER — Encounter: Payer: Self-pay | Admitting: Internal Medicine

## 2021-08-17 ENCOUNTER — Encounter: Payer: Self-pay | Admitting: Internal Medicine

## 2021-08-18 ENCOUNTER — Encounter: Payer: Self-pay | Admitting: Internal Medicine

## 2021-08-20 ENCOUNTER — Encounter: Payer: Self-pay | Admitting: Internal Medicine

## 2021-08-20 DIAGNOSIS — W19XXXA Unspecified fall, initial encounter: Secondary | ICD-10-CM | POA: Insufficient documentation

## 2021-08-20 DIAGNOSIS — F0390 Unspecified dementia without behavioral disturbance: Secondary | ICD-10-CM | POA: Insufficient documentation

## 2021-08-20 NOTE — Assessment & Plan Note (Signed)
Overall mild, declines need for change in tx or referral for counseling ?

## 2021-08-20 NOTE — Assessment & Plan Note (Signed)
Severe uncontrolled, declines referral to ED, tonight for restart hydralazine 25-50 mg tid, while getting started on benicar 40 and amlodipine 5 new therapies, RN duaghter quite motivated to help; will let us know on mychart how transition is going and overall BP control ?

## 2021-08-20 NOTE — Assessment & Plan Note (Signed)
With worsening gneeralized weakness, for [place] with Pt - Kristen Mendoza per family request ?

## 2021-08-20 NOTE — Assessment & Plan Note (Signed)
Pt with worsening memory loss but very good family support, declines other eval or tx for now, for [facility] as above ?

## 2021-08-25 ENCOUNTER — Encounter: Payer: Self-pay | Admitting: Internal Medicine

## 2021-08-28 ENCOUNTER — Telehealth: Payer: Self-pay | Admitting: Internal Medicine

## 2021-08-28 NOTE — Telephone Encounter (Signed)
Kristen Mendoza with Kristen Mendoza calls today regarding PT's physical therapy assessment done today. They would like the approval of physical therapy ?3 times a week for one week ?2 times a week for 5 weeks ?Approval can be given over the phone at  ? ?CB: [phone] ?

## 2021-08-28 NOTE — Telephone Encounter (Signed)
Heather w/ [facility] making provider aware st pt informed her she does not want nursing, only physical therapy ? ?Heather states only  nursing eval was done today 4-24 ?

## 2021-08-29 NOTE — Telephone Encounter (Signed)
Ok for verbals 

## 2021-08-30 NOTE — Telephone Encounter (Signed)
Notified Arnold w/MD response../lmb 

## 2021-08-31 ENCOUNTER — Telehealth: Payer: Self-pay | Admitting: Internal Medicine

## 2021-08-31 NOTE — Telephone Encounter (Signed)
LVM for pt to rtn my call to schedule AWV with [facility]. Please schedule this appt if pt calls the office.  °

## 2021-09-04 DIAGNOSIS — M1711 Unilateral primary osteoarthritis, right knee: Secondary | ICD-10-CM | POA: Diagnosis not present

## 2021-10-27 ENCOUNTER — Encounter: Payer: Self-pay | Admitting: Internal Medicine

## 2021-11-02 ENCOUNTER — Telehealth: Payer: Self-pay | Admitting: Internal Medicine

## 2021-11-02 NOTE — Telephone Encounter (Signed)
Ok to give benicar as this does not affect the HR  Also HR in the 50s is fine normally,  thanks

## 2021-11-02 NOTE — Telephone Encounter (Signed)
Patient's daughter states that patient's heart rate has been in the 50s in the mornings. Per patient's daughter, holding off on taking Benicar until further instruction. Would like advising on what to do.

## 2021-11-02 NOTE — Telephone Encounter (Signed)
Daughter stated mothers B/P is in the 29s.would like to know if she should adjust her meds   Please Advise

## 2021-11-02 NOTE — Telephone Encounter (Signed)
Patient's daughter notified.

## 2022-01-30 DIAGNOSIS — L2089 Other atopic dermatitis: Secondary | ICD-10-CM | POA: Diagnosis not present

## 2022-01-30 DIAGNOSIS — E663 Overweight: Secondary | ICD-10-CM | POA: Diagnosis not present

## 2022-01-31 ENCOUNTER — Encounter: Payer: Self-pay | Admitting: Internal Medicine

## 2022-01-31 ENCOUNTER — Ambulatory Visit: Payer: Medicare PPO | Admitting: Internal Medicine

## 2022-01-31 VITALS — BP 156/98 | HR 92 | Ht 62.0 in | Wt 161.0 lb

## 2022-01-31 DIAGNOSIS — Z23 Encounter for immunization: Secondary | ICD-10-CM | POA: Diagnosis not present

## 2022-01-31 DIAGNOSIS — F03A4 Unspecified dementia, mild, with anxiety: Secondary | ICD-10-CM | POA: Diagnosis not present

## 2022-01-31 DIAGNOSIS — I872 Venous insufficiency (chronic) (peripheral): Secondary | ICD-10-CM

## 2022-01-31 DIAGNOSIS — E559 Vitamin D deficiency, unspecified: Secondary | ICD-10-CM

## 2022-01-31 DIAGNOSIS — I10 Essential (primary) hypertension: Secondary | ICD-10-CM | POA: Diagnosis not present

## 2022-01-31 MED ORDER — AMLODIPINE BESYLATE 5 MG PO TABS: 5.0000 mg | ORAL_TABLET | Freq: Every day | ORAL | 3 refills | Status: DC

## 2022-01-31 MED ORDER — OLMESARTAN MEDOXOMIL 20 MG PO TABS: 20.0000 mg | ORAL_TABLET | Freq: Every day | ORAL | 3 refills | Status: DC

## 2022-01-31 NOTE — Progress Notes (Signed)
Patient ID: Kristen Mendoza, female   DOB: Apr 11, 1941, 81 y.o.   MRN: [id number]        Chief Complaint: follow up HTN, leg swelling and rash       HPI:  Kristen Mendoza is a 81 y.o. female here with family, overall doing ok, Pt denies chest pain, increased sob or doe, wheezing, orthopnea, PND, palpitations, dizziness or syncope, though has some persistent leg swelling that goes away in the am, and worse in the pm, now with worsening brownish rash, with mild soreness and redness in the past wk.   Pt denies polydipsia, polyuria, or new focal neuro s/s.    Pt denies fever, wt loss, night sweats, loss of appetite, or other constitutional symptoms Dementia overall stable symptomatically, and not assoc with behavioral changes such as hallucinations, paranoia, or agitation.       Wt Readings from Last 3 Encounters:  01/31/22 161 lb (73 kg)  08/15/21 162 lb (73.5 kg)  08/04/21 163 lb 3.2 oz (74 kg)   BP Readings from Last 3 Encounters:  01/31/22 (!) 156/98  08/15/21 (!) 218/82  08/04/21 (!) 158/90         Past Medical History:  Diagnosis Date   Allergic rhinitis 04/19/2010   Overview:  Allergic Rhinitis  10/1 IMO update   Anxiety state 07/21/2010   Overview:  Anxiety Disorder NOS  10/1 IMO update   Arthritis    right knee   Asthma    dx'd years ago but no current issues    Cancer ([place])    endometrial cancer-14 yeras ago- surgery only   Hypertension    Vitamin D deficiency 07/21/2010   Overview:  Vitamin D Deficiency  10/1 IMO update   Past Surgical History:  Procedure Laterality Date   ABDOMINAL HYSTERECTOMY     BLADDER SURGERY     bladder tack    COLONOSCOPY  11-25-2008   polyps, tics, hems- TA x 1-perry    knee cap dislocation     40+ years ago   POLYPECTOMY      reports that she has never smoked. She has never used smokeless tobacco. She reports that she does not drink alcohol and does not use drugs. family history is not on file. No Known Allergies Current Outpatient Medications on  File Prior to Visit  Medication Sig Dispense Refill   triamcinolone cream (KENALOG) 0.1 % Apply 1 Application topically 2 (two) times daily.     acetaminophen (TYLENOL) 650 MG CR tablet Take 650 mg by mouth every 8 (eight) hours as needed for pain or fever.     No current facility-administered medications on file prior to visit.        ROS:  All others reviewed and negative.  Objective        PE:  BP (!) 156/98   Pulse 92   Ht '5\' 2"'$  (1.575 m)   Wt 161 lb (73 kg)   SpO2 98%   BMI 29.45 kg/m                 Constitutional: Pt appears in NAD               HENT: Head: NCAT.                Right Ear: External ear normal.                 Left Ear: External ear normal.  Eyes: . Pupils are equal, round, and reactive to light. Conjunctivae and EOM are normal               Nose: without d/c or deformity               Neck: Neck supple. Gross normal ROM               Cardiovascular: Normal rate and regular rhythm.                 Pulmonary/Chest: Effort normal and breath sounds without rales or wheezing.                Abd:  Soft, NT, ND, + BS, no organomegaly               Neurological: Pt is alert. At baseline orientation, motor grossly intact               Skin: Skin is warm. No rashes, no other new lesions, LE edema - trace to 1+ bilateral with brownish erythema to both anterior legs below the knees to ankles               Psychiatric: Pt behavior is normal without agitation   Micro: none  Cardiac tracings I have personally interpreted today:  none  Pertinent Radiological findings (summarize): none   Lab Results  Component Value Date   WBC 8.8 08/04/2021   HGB 13.8 08/04/2021   HCT 41.7 08/04/2021   PLT 297.0 08/04/2021   GLUCOSE 92 08/04/2021   CHOL 186 08/04/2021   TRIG 63.0 08/04/2021   HDL 72.90 08/04/2021   LDLCALC 100 (H) 08/04/2021   ALT 16 08/04/2021   AST 20 08/04/2021   NA 138 08/04/2021   K 3.7 08/04/2021   CL 103 08/04/2021   CREATININE 0.79  08/04/2021   BUN 22 08/04/2021   CO2 27 08/04/2021   TSH 3.88 08/04/2021   HGBA1C 5.7 08/04/2021   Assessment/Plan:  Kristen Mendoza is a 81 y.o. White or Caucasian [1] female with  has a past medical history of Allergic rhinitis (04/19/2010), Anxiety state (07/21/2010), Arthritis, Asthma, Cancer ([place]), Hypertension, and Vitamin D deficiency (07/21/2010).  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  Cathlean Cower, MD 02/03/2022 5:21 PM [facility]

## 2022-01-31 NOTE — Patient Instructions (Addendum)
Please have your Shingrix (shingles) shots done at your local pharmacy.  You had the flu shot today  Please continue all other medications as before, and refills have been done if requested.  Please have the pharmacy call with any other refills you may need.  Please continue your efforts at being more active, low cholesterol diet, and weight control.  You are otherwise up to date with prevention measures today.  Please keep your appointments with your specialists as you may have planned  Please make an Appointment to return in 6 months, or sooner if needed

## 2022-02-02 ENCOUNTER — Ambulatory Visit: Payer: Medicare PPO | Admitting: Internal Medicine

## 2022-02-03 ENCOUNTER — Encounter: Payer: Self-pay | Admitting: Internal Medicine

## 2022-02-03 DIAGNOSIS — I872 Venous insufficiency (chronic) (peripheral): Secondary | ICD-10-CM | POA: Insufficient documentation

## 2022-02-03 NOTE — Assessment & Plan Note (Signed)
BP Readings from Last 3 Encounters:  01/31/22 (!) 156/98  08/15/21 (!) 218/82  08/04/21 (!) 158/90   Uncontrolled, pt declines med change today,  pt to continue medical treatment benicar 20 mg , norvasc 5 mg

## 2022-02-03 NOTE — Assessment & Plan Note (Addendum)
Last vitamin D Lab Results  Component Value Date   VD25OH 25.02 (L) 08/04/2021   Low, reminded to start oral replacement

## 2022-02-03 NOTE — Assessment & Plan Note (Signed)
Overall stable, declines tx such as aricept or namenda

## 2022-02-03 NOTE — Assessment & Plan Note (Signed)
Mild, with stasis dermatitis flare, has steorid cream at home - to use prn, also compression stockings during day when improved

## 2022-02-06 ENCOUNTER — Ambulatory Visit: Payer: Medicare PPO | Admitting: Internal Medicine

## 2022-02-19 DIAGNOSIS — L2089 Other atopic dermatitis: Secondary | ICD-10-CM | POA: Diagnosis not present

## 2022-02-19 DIAGNOSIS — E663 Overweight: Secondary | ICD-10-CM | POA: Diagnosis not present

## 2022-02-19 DIAGNOSIS — L508 Other urticaria: Secondary | ICD-10-CM | POA: Diagnosis not present

## 2022-02-21 ENCOUNTER — Telehealth: Payer: Self-pay | Admitting: Internal Medicine

## 2022-02-21 NOTE — Telephone Encounter (Signed)
Left message for patient to call back and schedule Medicare Annual Wellness Visit (AWV).   Please offer to do virtually or by telephone.  Left office number and my jabber [phone].  Last AWV:10/05/2012  Please schedule at anytime with Nurse Health Advisor.

## 2022-03-27 DIAGNOSIS — L309 Dermatitis, unspecified: Secondary | ICD-10-CM | POA: Diagnosis not present

## 2022-03-27 DIAGNOSIS — E663 Overweight: Secondary | ICD-10-CM | POA: Diagnosis not present

## 2022-03-27 DIAGNOSIS — L2089 Other atopic dermatitis: Secondary | ICD-10-CM | POA: Diagnosis not present

## 2022-03-31 IMAGING — MG MM DIGITAL SCREENING BILAT W/ TOMO AND CAD
8 series · 9 of 24 positions shown · non-contrast
Comparison: Previous exam(s).

CLINICAL DATA: Screening.

EXAM:
DIGITAL SCREENING BILATERAL MAMMOGRAM WITH TOMOSYNTHESIS AND CAD
TECHNIQUE: Bilateral screening digital craniocaudal and mediolateral oblique
mammograms were obtained. Bilateral screening digital breast
tomosynthesis was performed. The images were evaluated with
computer-aided detection.

[R MLO synth-2D]
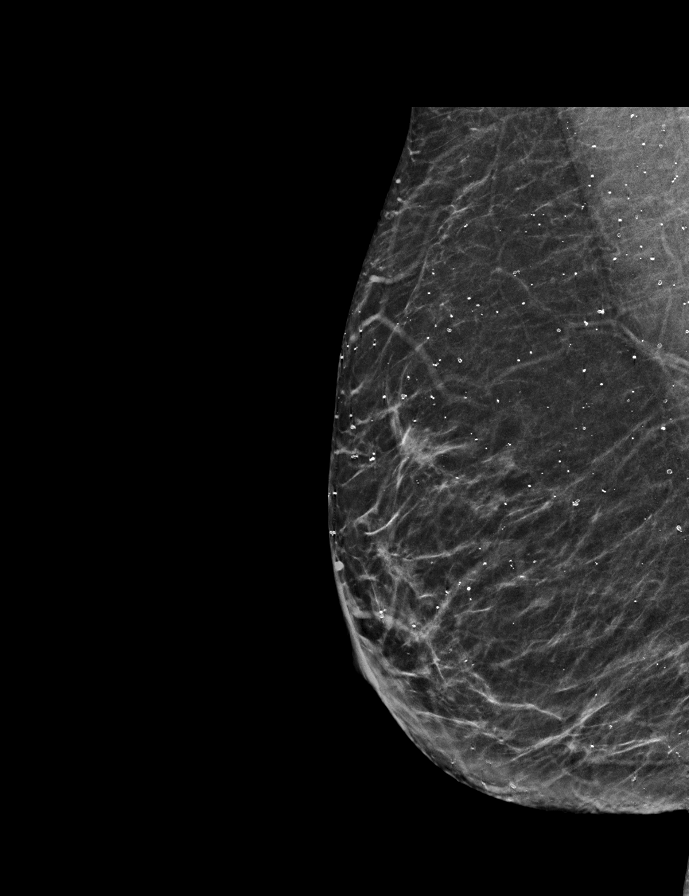

[L CC synth-2D]
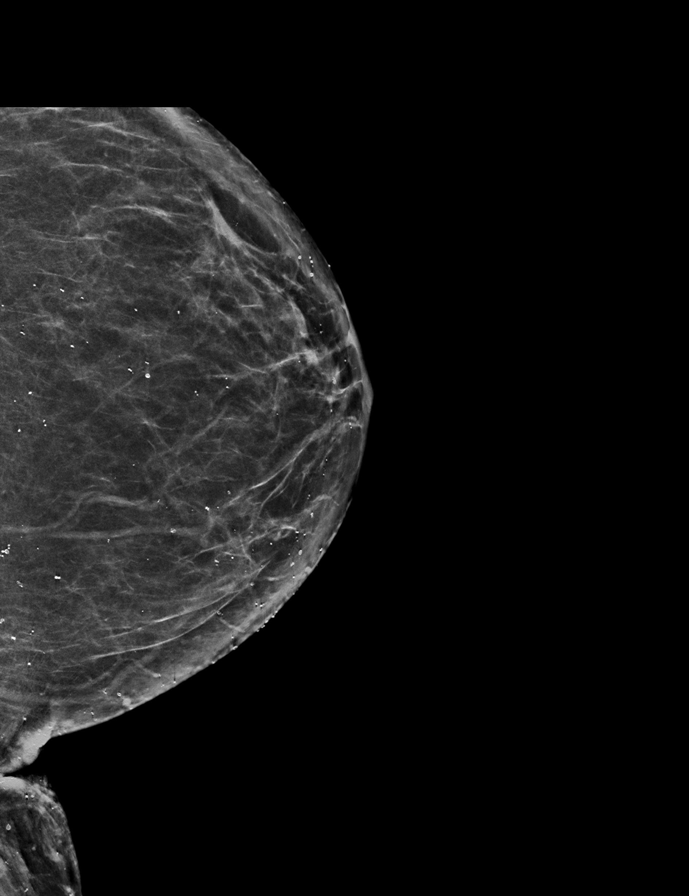

[L MLO synth-2D]
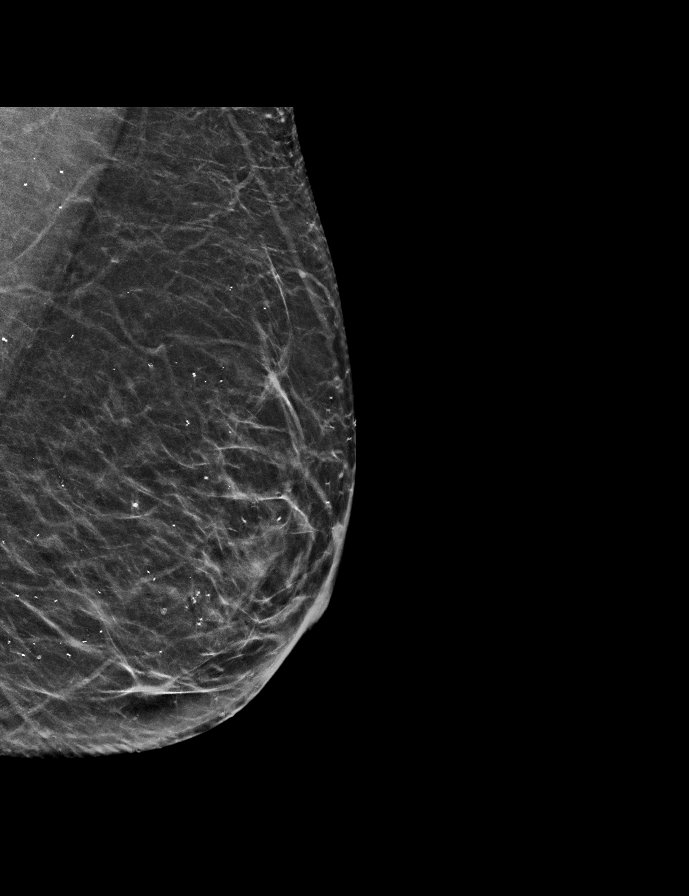

[R CC synth-2D]
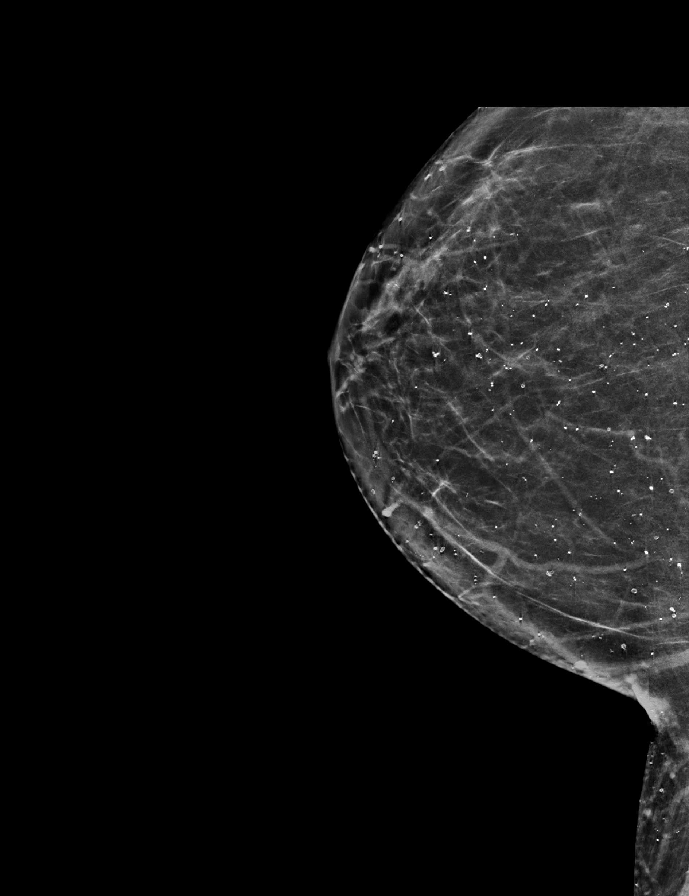

[L CC tomo · 2 of 63 frames shown]
[frame 21/63]
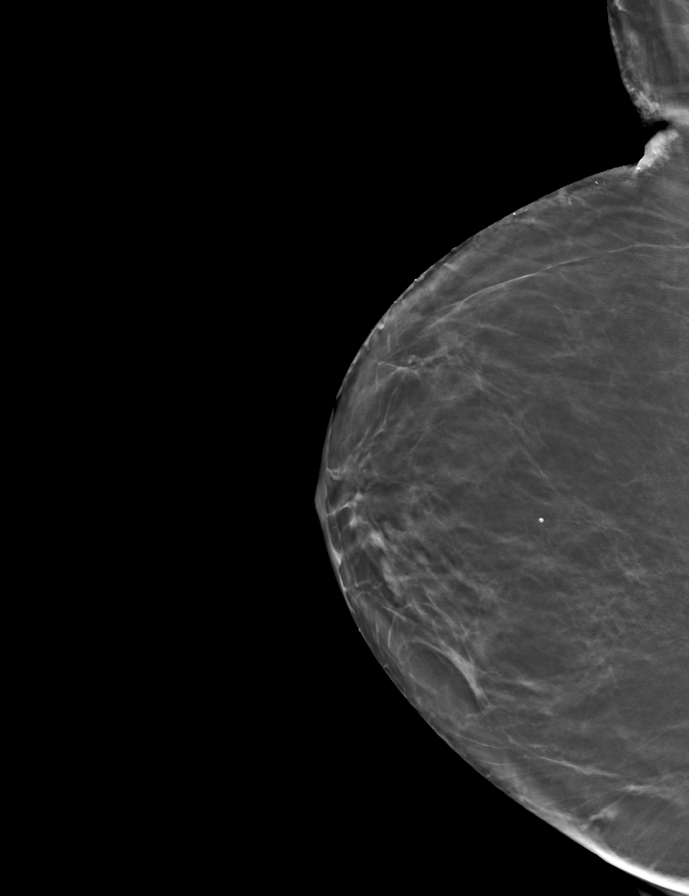
[frame 32/63]
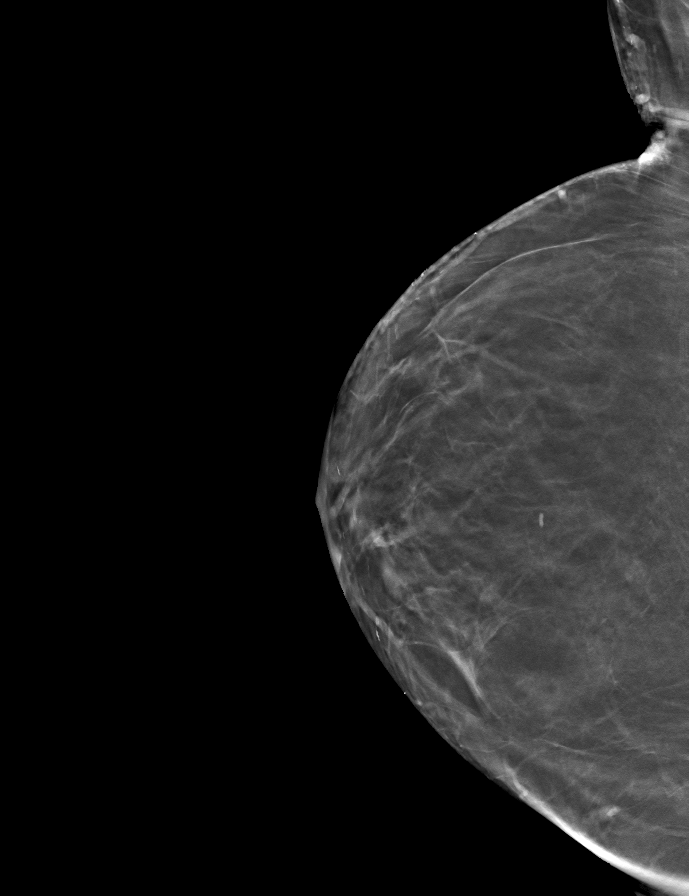

[L MLO tomo · tomo slice 31/60.0]
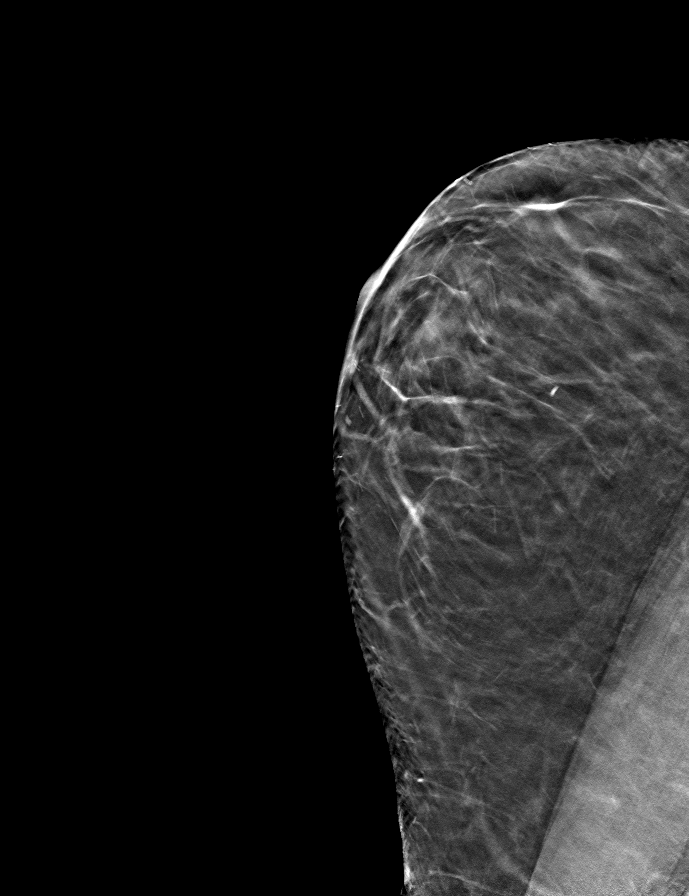

[R CC tomo · tomo slice 31/60.0]
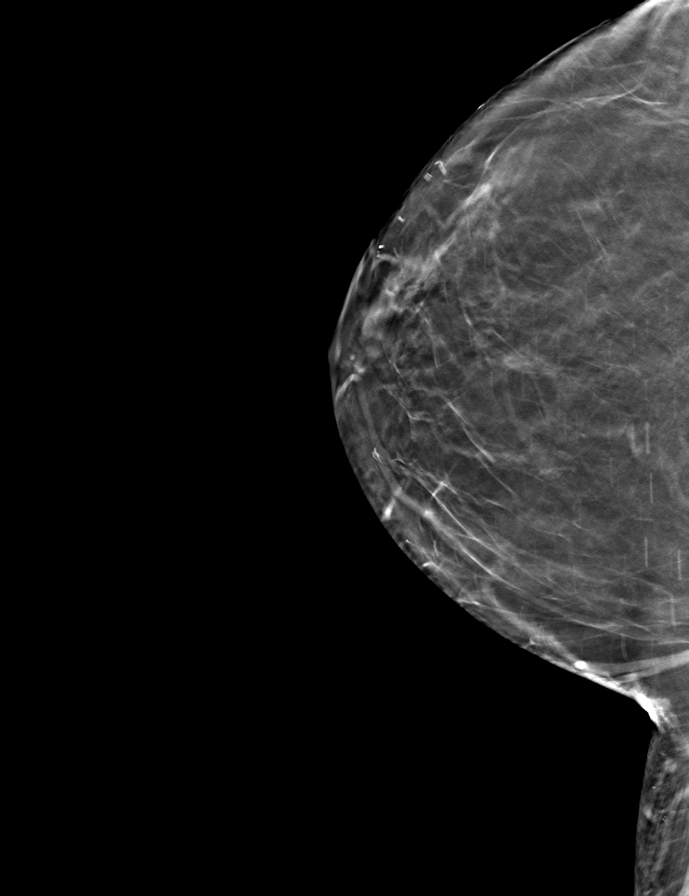

[R MLO tomo · tomo slice 30/59.0]
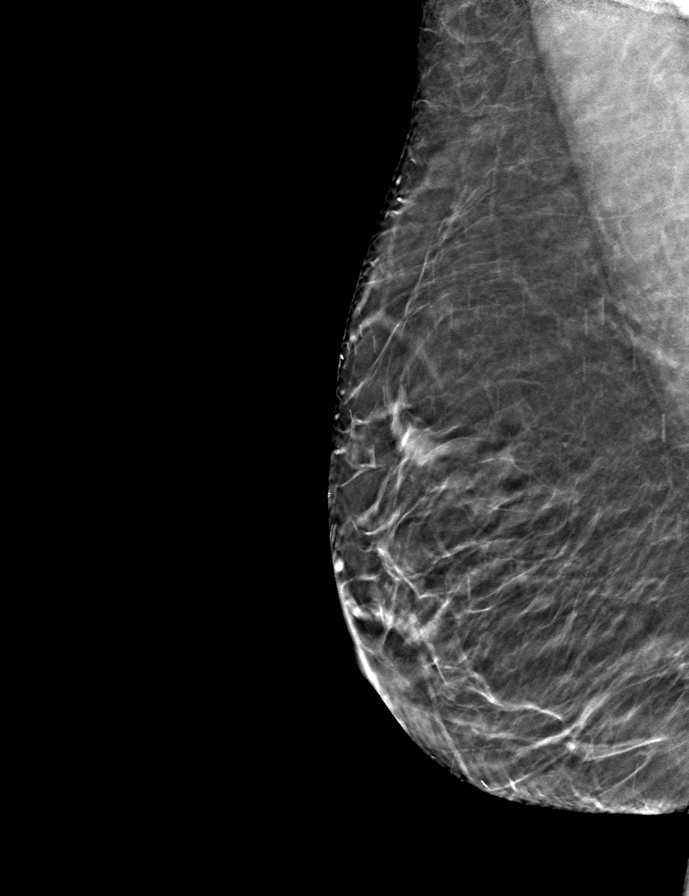

[9 of 24 positions shown; findings below may reference images not displayed]

ACR Breast Density Category b: There are scattered areas of
fibroglandular density.
FINDINGS: There are no findings suspicious for malignancy.
IMPRESSION: No mammographic evidence of malignancy. A result letter of this
screening mammogram will be mailed directly to the patient.

RECOMMENDATION:
Screening mammogram in one year. (Code:51-O-LD2)

BI-RADS CATEGORY  1: Negative.

## 2022-05-08 DIAGNOSIS — L309 Dermatitis, unspecified: Secondary | ICD-10-CM | POA: Diagnosis not present

## 2022-05-08 DIAGNOSIS — E663 Overweight: Secondary | ICD-10-CM | POA: Diagnosis not present

## 2022-05-08 DIAGNOSIS — L2089 Other atopic dermatitis: Secondary | ICD-10-CM | POA: Diagnosis not present

## 2022-07-27 ENCOUNTER — Encounter: Payer: Self-pay | Admitting: Internal Medicine

## 2022-07-31 DIAGNOSIS — L309 Dermatitis, unspecified: Secondary | ICD-10-CM | POA: Diagnosis not present

## 2022-07-31 DIAGNOSIS — L2089 Other atopic dermatitis: Secondary | ICD-10-CM | POA: Diagnosis not present

## 2022-07-31 NOTE — Telephone Encounter (Signed)
Please advise if possible, patient has an OV on 3/27

## 2022-08-01 ENCOUNTER — Ambulatory Visit: Payer: Medicare PPO | Admitting: Internal Medicine

## 2022-08-01 VITALS — BP 162/80 | HR 86 | Temp 98.1°F | Ht 62.0 in | Wt 161.2 lb

## 2022-08-01 DIAGNOSIS — E559 Vitamin D deficiency, unspecified: Secondary | ICD-10-CM

## 2022-08-01 DIAGNOSIS — Z0001 Encounter for general adult medical examination with abnormal findings: Secondary | ICD-10-CM | POA: Diagnosis not present

## 2022-08-01 DIAGNOSIS — E538 Deficiency of other specified B group vitamins: Secondary | ICD-10-CM

## 2022-08-01 DIAGNOSIS — I10 Essential (primary) hypertension: Secondary | ICD-10-CM

## 2022-08-01 DIAGNOSIS — N393 Stress incontinence (female) (male): Secondary | ICD-10-CM | POA: Diagnosis not present

## 2022-08-01 DIAGNOSIS — L309 Dermatitis, unspecified: Secondary | ICD-10-CM | POA: Diagnosis not present

## 2022-08-01 DIAGNOSIS — R739 Hyperglycemia, unspecified: Secondary | ICD-10-CM | POA: Diagnosis not present

## 2022-08-01 DIAGNOSIS — R32 Unspecified urinary incontinence: Secondary | ICD-10-CM | POA: Insufficient documentation

## 2022-08-01 DIAGNOSIS — R030 Elevated blood-pressure reading, without diagnosis of hypertension: Secondary | ICD-10-CM

## 2022-08-01 LAB — BASIC METABOLIC PANEL
BUN: 25 mg/dL — ABNORMAL HIGH (ref 6–23)
CO2: 27 mEq/L (ref 19–32)
Calcium: 10 mg/dL (ref 8.4–10.5)
Chloride: 102 mEq/L (ref 96–112)
Creatinine, Ser: 0.89 mg/dL (ref 0.40–1.20)
GFR: 61.56 mL/min (ref >60.00)
Glucose, Bld: 103 mg/dL — ABNORMAL HIGH (ref 70–99)
Potassium: 4.5 mEq/L (ref 3.5–5.1)
Sodium: 138 mEq/L (ref 135–145)

## 2022-08-01 LAB — CBC WITH DIFFERENTIAL/PLATELET
Basophils Absolute: 0.1 10*3/uL (ref 0.0–0.1)
Basophils Relative: 0.7 % (ref 0.0–3.0)
Eosinophils Absolute: 1 10*3/uL — ABNORMAL HIGH (ref 0.0–0.7)
Eosinophils Relative: 8.1 % — ABNORMAL HIGH (ref 0.0–5.0)
HCT: 40 % (ref 36.0–46.0)
Hemoglobin: 13.3 g/dL (ref 12.0–15.0)
Lymphocytes Relative: 15.5 % (ref 12.0–46.0)
Lymphs Abs: 1.8 10*3/uL (ref 0.7–4.0)
MCHC: 33.2 g/dL (ref 30.0–36.0)
MCV: 95.3 fl (ref 78.0–100.0)
Monocytes Absolute: 0.9 10*3/uL (ref 0.1–1.0)
Monocytes Relative: 7.2 % (ref 3.0–12.0)
Neutro Abs: 8.2 10*3/uL — ABNORMAL HIGH (ref 1.4–7.7)
Neutrophils Relative %: 68.5 % (ref 43.0–77.0)
Platelets: 384 10*3/uL (ref 150.0–400.0)
RBC: 4.2 Mil/uL (ref 3.87–5.11)
RDW: 13.8 % (ref 11.5–15.5)
WBC: 11.9 10*3/uL — ABNORMAL HIGH (ref 4.0–10.5)

## 2022-08-01 LAB — MICROALBUMIN / CREATININE URINE RATIO
Creatinine,U: 87.3 mg/dL
Microalb Creat Ratio: 3.3 mg/g (ref 0.0–30.0)
Microalb, Ur: 2.9 mg/dL — ABNORMAL HIGH (ref 0.0–1.9)

## 2022-08-01 LAB — HEMOGLOBIN A1C: Hgb A1c MFr Bld: 5.9 % (ref 4.6–6.5)

## 2022-08-01 LAB — LIPID PANEL
Cholesterol: 202 mg/dL — ABNORMAL HIGH (ref 0–200)
HDL: 76.3 mg/dL (ref >39.00)
LDL Cholesterol: 110 mg/dL — ABNORMAL HIGH (ref 0–99)
NonHDL: 125.86
Total CHOL/HDL Ratio: 3
Triglycerides: 77 mg/dL (ref 0.0–149.0)
VLDL: 15.4 mg/dL (ref 0.0–40.0)

## 2022-08-01 LAB — HEPATIC FUNCTION PANEL
ALT: 15 U/L (ref 0–35)
AST: 20 U/L (ref 0–37)
Albumin: 4.5 g/dL (ref 3.5–5.2)
Alkaline Phosphatase: 68 U/L (ref 39–117)
Bilirubin, Direct: 0.2 mg/dL (ref 0.0–0.3)
Total Bilirubin: 0.8 mg/dL (ref 0.2–1.2)
Total Protein: 7.6 g/dL (ref 6.0–8.3)

## 2022-08-01 LAB — TSH: TSH: 2.7 u[IU]/mL (ref 0.35–5.50)

## 2022-08-01 LAB — VITAMIN B12: Vitamin B-12: 420 pg/mL (ref 211–911)

## 2022-08-01 LAB — VITAMIN D 25 HYDROXY (VIT D DEFICIENCY, FRACTURES): VITD: 24.48 ng/mL — ABNORMAL LOW (ref 30.00–100.00)

## 2022-08-01 NOTE — Patient Instructions (Addendum)
Please have your Shingrix (shingles) shots done at your local pharmacy, and the Tdap tetanus shot as well  Please continue all other medications as before, and refills have been done if requested.  Please have the pharmacy call with any other refills you may need.  Please continue your efforts at being more active, low cholesterol diet, and weight control.  You are otherwise up to date with prevention measures today.  Please keep your appointments with your specialists as you may have planned  You will be contacted regarding the referral for: Urology in [facility]  Please go to the LAB at the blood drawing area for the tests to be done  You will be contacted by phone if any changes need to be made immediately.  Otherwise, you will receive a letter about your results with an explanation, but please check with MyChart first.  Please remember to sign up for MyChart if you have not done so, as this will be important to you in the future with finding out test results, communicating by private email, and scheduling acute appointments online when needed.  Please make an Appointment to return in 6 months, or sooner if needed

## 2022-08-01 NOTE — Progress Notes (Signed)
Patient ID: Kristen Mendoza, female   DOB: 05-07-1941, 81 y.o.   MRN: [id number]         Chief Complaint:: wellness exam and urinary incontinence, eczema, htn, low vit d       HPI:  Kristen Mendoza is a 82 y.o. female here for wellness exam; for shingrix at pharmacy, declines tdap and hep c screen, o/w up to date                        Also s/p bladder tack x 20 yrs, now with 1 yr of worsening urinary incontinence worse with stressors.  Denies urinary symptoms such as dysuria, frequency, urgency, flank pain, hematuria or n/v, fever, chills.  BP has been controlled at home.  Pt denies chest pain, increased sob or doe, wheezing, orthopnea, PND, increased LE swelling, palpitations, dizziness or syncope.   Pt denies polydipsia, polyuria, or new focal neuro s/s.    Pt denies fever, wt loss, night sweats, loss of appetite, or other constitutional symptoms  Did see dermatology yesterday and started tx with she thinks Dupixent for severe eczema or other dermatitis, wondering if I though this was ok.     Wt Readings from Last 3 Encounters:  08/01/22 161 lb 4 oz (73.1 kg)  01/31/22 161 lb (73 kg)  08/15/21 162 lb (73.5 kg)   BP Readings from Last 3 Encounters:  08/01/22 (!) 162/80  01/31/22 (!) 156/98  08/15/21 (!) 218/82   Immunization History  Administered Date(s) Administered   Fluad Quad(high Dose 65+) 01/26/2021, 01/31/2022   Influenza, High Dose Seasonal PF 02/09/2019, 01/22/2020   PFIZER(Purple Top)SARS-COV-2 Vaccination 06/19/2019, 07/12/2019   Pneumococcal Conjugate-13 08/13/2019   Pneumococcal Polysaccharide-23 10/05/2009   Tdap 10/05/2009   Health Maintenance Due  Topic Date Due   Medicare Annual Wellness (AWV)  Never done   Zoster Vaccines- Shingrix (1 of 2) Never done   DTaP/Tdap/Td (2 - Td or Tdap) 10/06/2019      Past Medical History:  Diagnosis Date   Allergic rhinitis 04/19/2010   Overview:  Allergic Rhinitis  10/1 IMO update   Anxiety state 07/21/2010   Overview:  Anxiety  Disorder NOS  10/1 IMO update   Arthritis    right knee   Asthma    dx'd years ago but no current issues    Cancer ([place])    endometrial cancer-14 yeras ago- surgery only   Hypertension    Vitamin D deficiency 07/21/2010   Overview:  Vitamin D Deficiency  10/1 IMO update   Past Surgical History:  Procedure Laterality Date   ABDOMINAL HYSTERECTOMY     BLADDER SURGERY     bladder tack    COLONOSCOPY  11-25-2008   polyps, tics, hems- TA x 1-perry    knee cap dislocation     40+ years ago   POLYPECTOMY      reports that she has never smoked. She has never used smokeless tobacco. She reports that she does not drink alcohol and does not use drugs. family history is not on file. No Known Allergies Current Outpatient Medications on File Prior to Visit  Medication Sig Dispense Refill   acetaminophen (TYLENOL) 650 MG CR tablet Take 650 mg by mouth every 8 (eight) hours as needed for pain or fever.     amLODipine (NORVASC) 5 MG tablet Take 1 tablet (5 mg total) by mouth daily. 90 tablet 3   olmesartan (BENICAR) 20 MG tablet Take 1 tablet (20  mg total) by mouth daily. 90 tablet 3   triamcinolone cream (KENALOG) 0.1 % Apply 1 Application topically 2 (two) times daily.     No current facility-administered medications on file prior to visit.        ROS:  All others reviewed and negative.  Objective        PE:  BP (!) 162/80   Pulse 86   Temp 98.1 F (36.7 C) (Temporal)   Ht 5\' 2"  (1.575 m)   Wt 161 lb 4 oz (73.1 kg)   SpO2 97%   BMI 29.49 kg/m                 Constitutional: Pt appears in NAD               HENT: Head: NCAT.                Right Ear: External ear normal.                 Left Ear: External ear normal.                Eyes: . Pupils are equal, round, and reactive to light. Conjunctivae and EOM are normal               Nose: without d/c or deformity               Neck: Neck supple. Gross normal ROM               Cardiovascular: Normal rate and regular rhythm.                  Pulmonary/Chest: Effort normal and breath sounds without rales or wheezing.                Abd:  Soft, NT, ND, + BS, no organomegaly               Neurological: Pt is alert. At baseline orientation, motor grossly intact               Skin: Skin is warm. LE edema - none               Psychiatric: Pt behavior is normal without agitation   Micro: none  Cardiac tracings I have personally interpreted today:  none  Pertinent Radiological findings (summarize): none   Lab Results  Component Value Date   WBC 11.9 (H) 08/01/2022   HGB 13.3 08/01/2022   HCT 40.0 08/01/2022   PLT 384.0 08/01/2022   GLUCOSE 103 (H) 08/01/2022   CHOL 202 (H) 08/01/2022   TRIG 77.0 08/01/2022   HDL 76.30 08/01/2022   LDLCALC 110 (H) 08/01/2022   ALT 15 08/01/2022   AST 20 08/01/2022   NA 138 08/01/2022   K 4.5 08/01/2022   CL 102 08/01/2022   CREATININE 0.89 08/01/2022   BUN 25 (H) 08/01/2022   CO2 27 08/01/2022   TSH 2.70 08/01/2022   HGBA1C 5.9 08/01/2022   MICROALBUR 2.9 (H) 08/01/2022   Assessment/Plan:  [name] is a 82 y.o. White or Caucasian [1] female with  has a past medical history of Allergic rhinitis (04/19/2010), Anxiety state (07/21/2010), Arthritis, Asthma, Cancer ([place]), Hypertension, and Vitamin D deficiency (07/21/2010).  Encounter for well adult exam with abnormal findings Age and sex appropriate education and counseling updated with regular exercise and diet Referrals for preventative services - declines hep c screen Immunizations addressed - for shingrix at the pharmacy,  declines tdap Smoking counseling  - none needed Evidence for depression or other mood disorder - none significant Most recent labs reviewed. I have personally reviewed and have noted: 1) the patient's medical and social history 2) The patient's current medications and supplements 3) The patient's height, weight, and BMI have been recorded in the chart   Eczema D/w pt, I support derm recommendation  for dupixent if this is the medication she began yesterday  Hypertension BP Readings from Last 3 Encounters:  08/01/22 (!) 162/80  01/31/22 (!) 156/98  08/15/21 (!) 218/82   Persistently uncontrolled recentlty, pt adamant BP ok at home, delcines any change,, pt to continue medical treatment norvasc 10 qd, benicar 20 qd   Urinary incontinence Worsening x 1 yr - for urine cx and refer urology in [place] per pt request as she is dissatisfied with [facility] in [place]  Vitamin D deficiency Last vitamin D Lab Results  Component Value Date   VD25OH 24.48 (L) 08/01/2022   Low, to start oral replacement  Followup: Return in about 6 months (around 02/01/2023).  Cathlean Cower, MD 08/04/2022 5:13 PM [facility]

## 2022-08-02 LAB — URINE CULTURE

## 2022-08-04 ENCOUNTER — Encounter: Payer: Self-pay | Admitting: Internal Medicine

## 2022-08-04 NOTE — Assessment & Plan Note (Signed)
Worsening x 1 yr - for urine cx and refer urology in [place] per pt request as she is dissatisfied with [facility] in [place]

## 2022-08-04 NOTE — Assessment & Plan Note (Signed)
BP Readings from Last 3 Encounters:  08/01/22 (!) 162/80  01/31/22 (!) 156/98  08/15/21 (!) 218/82   Persistently uncontrolled recentlty, pt adamant BP ok at home, delcines any change,, pt to continue medical treatment norvasc 10 qd, benicar 20 qd

## 2022-08-04 NOTE — Assessment & Plan Note (Signed)
Last vitamin D Lab Results  Component Value Date   VD25OH 24.48 (L) 08/01/2022   Low, to start oral replacement

## 2022-08-04 NOTE — Assessment & Plan Note (Signed)
Age and sex appropriate education and counseling updated with regular exercise and diet Referrals for preventative services - declines hep c screen Immunizations addressed - for shingrix at the pharmacy, declines tdap Smoking counseling  - none needed Evidence for depression or other mood disorder - none significant Most recent labs reviewed. I have personally reviewed and have noted: 1) the patient's medical and social history 2) The patient's current medications and supplements 3) The patient's height, weight, and BMI have been recorded in the chart

## 2022-08-04 NOTE — Assessment & Plan Note (Signed)
D/w pt, I support derm recommendation for dupixent if this is the medication she began yesterday

## 2022-08-05 ENCOUNTER — Encounter: Payer: Self-pay | Admitting: Internal Medicine

## 2022-08-07 ENCOUNTER — Encounter: Payer: Self-pay | Admitting: Urology

## 2022-08-07 ENCOUNTER — Ambulatory Visit: Payer: Medicare PPO | Admitting: Urology

## 2022-08-07 VITALS — BP 131/84 | HR 103 | Ht 62.0 in | Wt 161.0 lb

## 2022-08-07 DIAGNOSIS — N3941 Urge incontinence: Secondary | ICD-10-CM | POA: Insufficient documentation

## 2022-08-07 LAB — BLADDER SCAN AMB NON-IMAGING

## 2022-08-07 MED ORDER — AMLODIPINE BESYLATE 10 MG PO TABS
10.0000 mg | ORAL_TABLET | Freq: Every day | ORAL | 3 refills | Status: DC
Start: 2022-08-07 — End: 2022-08-14

## 2022-08-07 MED ORDER — GEMTESA 75 MG PO TABS
75.0000 mg | ORAL_TABLET | Freq: Every day | ORAL | 0 refills | Status: DC
Start: 2022-08-07 — End: 2022-09-03

## 2022-08-07 NOTE — Progress Notes (Signed)
Assessment: 1. Urge incontinence     Plan: I personally reviewed the patient's chart including provider notes, and lab results. Diagnosis and management of overactive bladder and urge incontinence discussed with the patient and her daughter today.   First line therapy with avoidance of dietary irritants and behavioral modification discussed.  Second line therapy with medications discussed.  The goal of medical therapy including potential side effects reviewed.  Third line therapy with neuromodulation (PTNS, sacral neuromodulation), and chemodenervation reviewed.  Information provided regarding incontinence and treatment options. Bladder diet sheet provided. Trial of Gemtesa 75 mg daily.  Samples given. Return to office in 4 weeks   Chief Complaint:  Chief Complaint  Patient presents with   Urinary Incontinence    History of Present Illness:  Kristen Mendoza is a 82 y.o. female who is seen in consultation from Biagio Borg, MD for evaluation of urinary incontinence. She reports symptoms x 18 months.  She has urinary frequency, voiding every 2-3 hours during the day and nocturia every 1-2 hours.  She has urinary urgency and associated incontinence.  This primarily occurs at night.  No dysuria or gross hematuria.  She feels like she empties her bladder completely.  She is not having any problems with fecal incontinence.  No constipation. No prior treatment for her incontinence symptoms.  She is status post a total abdominal hysterectomy for endometrial cancer in 2003.  She underwent a bladder suspension in 1974 in [place].  Urine culture from 08/01/2022 showed <10K gm + organism.    Past Medical History:  Past Medical History:  Diagnosis Date   Allergic rhinitis 04/19/2010   Overview:  Allergic Rhinitis  10/1 IMO update   Anxiety state 07/21/2010   Overview:  Anxiety Disorder NOS  10/1 IMO update   Arthritis    right knee   Asthma    dx'd years ago but no current issues     Cancer ([place])    endometrial cancer-14 yeras ago- surgery only   Hypertension    Vitamin D deficiency 07/21/2010   Overview:  Vitamin D Deficiency  10/1 IMO update    Past Surgical History:  Past Surgical History:  Procedure Laterality Date   ABDOMINAL HYSTERECTOMY     BLADDER SURGERY     bladder tack    COLONOSCOPY  11-25-2008   polyps, tics, hems- TA x 1-perry    knee cap dislocation     40+ years ago   POLYPECTOMY      Allergies:  No Known Allergies  Family History:  Family History  Problem Relation Age of Onset   Colon cancer Neg Hx    Colon polyps Neg Hx    Esophageal cancer Neg Hx    Rectal cancer Neg Hx    Stomach cancer Neg Hx    Breast cancer Neg Hx     Social History:  Social History   Tobacco Use   Smoking status: Never   Smokeless tobacco: Never  Vaping Use   Vaping Use: Never used  Substance Use Topics   Alcohol use: No    Alcohol/week: 0.0 standard drinks of alcohol   Drug use: No    Review of symptoms:  Constitutional:  Negative for unexplained weight loss, night sweats, fever, chills ENT:  Negative for nose bleeds, sinus pain, painful swallowing CV:  Negative for chest pain, shortness of breath, exercise intolerance, palpitations, loss of consciousness Resp:  Negative for cough, wheezing, shortness of breath GI:  Negative for nausea, vomiting, diarrhea,  bloody stools GU:  Positives noted in HPI; otherwise negative for gross hematuria, dysuria Neuro:  Negative for seizures, poor balance, limb weakness, slurred speech Psych:  Negative for lack of energy, depression, anxiety Endocrine:  Negative for polydipsia, polyuria, symptoms of hypoglycemia (dizziness, hunger, sweating) Hematologic:  Negative for anemia, purpura, petechia, prolonged or excessive bleeding, use of anticoagulants  Allergic:  Negative for difficulty breathing or choking as a result of exposure to anything; no shellfish allergy; no allergic response (rash/itch) to materials,  foods  Physical exam: BP 131/84   Pulse (!) 103   Ht 5\' 2"  (1.575 m)   Wt 161 lb (73 kg)   BMI 29.45 kg/m  GENERAL APPEARANCE:  Well appearing, well developed, well nourished, NAD HEENT: Atraumatic, Normocephalic, oropharynx clear. NECK: Supple without lymphadenopathy or thyromegaly. LUNGS: Clear to auscultation bilaterally. HEART: Regular Rate and Rhythm without murmurs, gallops, or rubs. ABDOMEN: Soft, non-tender, No Masses. EXTREMITIES: Moves all extremities well.  Without clubbing, cyanosis, or edema. NEUROLOGIC:  Alert and oriented x 3, uses walker, CN II-XII grossly intact.  MENTAL STATUS:  Appropriate. BACK:  Non-tender to palpation.  No CVAT SKIN:  Warm, dry and intact.    Results: No sample provided  PVR:   3 ml

## 2022-08-14 MED ORDER — AMLODIPINE BESYLATE 5 MG PO TABS: 5.0000 mg | ORAL_TABLET | Freq: Every day | ORAL | 3 refills | Status: DC

## 2022-08-14 NOTE — Addendum Note (Signed)
Addended by: Corwin Levins on: 08/14/2022 04:12 PM   Modules accepted: Orders

## 2022-09-03 ENCOUNTER — Encounter: Payer: Self-pay | Admitting: Urology

## 2022-09-03 ENCOUNTER — Ambulatory Visit: Payer: Medicare PPO | Admitting: Urology

## 2022-09-03 VITALS — BP 169/76 | HR 98 | Ht 61.0 in | Wt 159.0 lb

## 2022-09-03 DIAGNOSIS — N3941 Urge incontinence: Secondary | ICD-10-CM | POA: Diagnosis not present

## 2022-09-03 LAB — BLADDER SCAN AMB NON-IMAGING

## 2022-09-03 MED ORDER — GEMTESA 75 MG PO TABS: 75.0000 mg | ORAL_TABLET | Freq: Every day | ORAL | 11 refills | Status: DC

## 2022-09-03 NOTE — Progress Notes (Signed)
Assessment: 1. Urge incontinence    Plan: Continue bladder diet  Continue Gemtesa 75 mg daily.  Samples and rx given. Return to office in 3 months  Chief Complaint:  Chief Complaint  Patient presents with   Urinary Incontinence    History of Present Illness:  Kristen Mendoza is a 82 y.o. female who is seen for continued evaluation of urinary incontinence. At her initial visit in 4/24, she reported symptoms x 18 months.  Symptoms include urinary frequency, voiding every 2-3 hours during the day and nocturia every 1-2 hours.  She also noted urinary urgency and associated incontinence, primarily at night.  No dysuria or gross hematuria.   She was not having any problems with fecal incontinence.  No constipation. No prior treatment for her incontinence symptoms.  She is status post a total abdominal hysterectomy for endometrial cancer in 2003.  She underwent a bladder suspension in 1974 in [place].  Urine culture from 08/01/2022 showed <10K gm + organism.   She was given a trial of Gemtesa 75 mg daily at her visit in 4/24.  She returns today for follow-up.  She continues on Gemtesa 75 mg daily.  She has noted improvement in her incontinence with the medication.  She does feel some slight difficulty with emptying her bladder in the morning.  This improves throughout the day.  No dysuria or gross hematuria.  Portions of the above documentation were copied from a prior visit for review purposes only.   Past Medical History:  Past Medical History:  Diagnosis Date   Allergic rhinitis 04/19/2010   Overview:  Allergic Rhinitis  10/1 IMO update   Anxiety state 07/21/2010   Overview:  Anxiety Disorder NOS  10/1 IMO update   Arthritis    right knee   Asthma    dx'd years ago but no current issues    Cancer ([facility])    endometrial cancer-14 yeras ago- surgery only   Hypertension    Vitamin D deficiency 07/21/2010   Overview:  Vitamin D Deficiency  10/1 IMO update    Past Surgical  History:  Past Surgical History:  Procedure Laterality Date   ABDOMINAL HYSTERECTOMY     BLADDER SURGERY     bladder tack    COLONOSCOPY  11-25-2008   polyps, tics, hems- TA x 1-perry    knee cap dislocation     40+ years ago   POLYPECTOMY      Allergies:  No Known Allergies  Family History:  Family History  Problem Relation Age of Onset   Colon cancer Neg Hx    Colon polyps Neg Hx    Esophageal cancer Neg Hx    Rectal cancer Neg Hx    Stomach cancer Neg Hx    Breast cancer Neg Hx     Social History:  Social History   Tobacco Use   Smoking status: Never   Smokeless tobacco: Never  Vaping Use   Vaping Use: Never used  Substance Use Topics   Alcohol use: No    Alcohol/week: 0.0 standard drinks of alcohol   Drug use: No    ROS: Constitutional:  Negative for fever, chills, weight loss CV: Negative for chest pain, previous MI, hypertension Respiratory:  Negative for shortness of breath, wheezing, sleep apnea, frequent cough GI:  Negative for nausea, vomiting, bloody stool, GERD  Physical exam: BP (!) 169/76   Pulse 98   Ht 5\' 1"  (1.549 m)   Wt 159 lb (72.1 kg)   BMI  30.04 kg/m  GENERAL APPEARANCE:  Well appearing, well developed, well nourished, NAD HEENT:  Atraumatic, normocephalic, oropharynx clear NECK:  Supple without lymphadenopathy or thyromegaly ABDOMEN:  Soft, non-tender, no masses EXTREMITIES:  Moves all extremities well, without clubbing, cyanosis, or edema NEUROLOGIC:  Alert and oriented x 3, normal gait, CN II-XII grossly intact MENTAL STATUS:  appropriate BACK:  Non-tender to palpation, No CVAT SKIN:  Warm, dry, and intact   Results: None  PVR:  0 ml

## 2022-09-13 ENCOUNTER — Encounter: Payer: Self-pay | Admitting: Internal Medicine

## 2022-09-13 DIAGNOSIS — W19XXXD Unspecified fall, subsequent encounter: Secondary | ICD-10-CM

## 2022-09-13 DIAGNOSIS — F03A4 Unspecified dementia, mild, with anxiety: Secondary | ICD-10-CM

## 2022-09-13 DIAGNOSIS — M170 Bilateral primary osteoarthritis of knee: Secondary | ICD-10-CM

## 2022-09-13 DIAGNOSIS — C801 Malignant (primary) neoplasm, unspecified: Secondary | ICD-10-CM

## 2022-09-13 NOTE — Telephone Encounter (Signed)
Done hardcopy to cma °

## 2022-10-04 ENCOUNTER — Ambulatory Visit
Admission: EM | Admit: 2022-10-04 | Discharge: 2022-10-04 | Disposition: A | Discharge status: Home / Self Care | Payer: Medicare PPO | Attending: Urgent Care | Admitting: Urgent Care

## 2022-10-04 DIAGNOSIS — N3001 Acute cystitis with hematuria: Secondary | ICD-10-CM | POA: Diagnosis not present

## 2022-10-04 DIAGNOSIS — R350 Frequency of micturition: Secondary | ICD-10-CM | POA: Insufficient documentation

## 2022-10-04 DIAGNOSIS — R300 Dysuria: Secondary | ICD-10-CM | POA: Diagnosis not present

## 2022-10-04 LAB — POCT URINALYSIS DIP (MANUAL ENTRY)
Bilirubin, UA: NEGATIVE
Glucose, UA: 100 mg/dL — AB
Ketones, POC UA: NEGATIVE mg/dL
Nitrite, UA: POSITIVE — AB
Protein Ur, POC: NEGATIVE mg/dL
Spec Grav, UA: <=1.005 — AB (ref 1.010–1.025)
Urobilinogen, UA: 1 E.U./dL
pH, UA: 5.5 (ref 5.0–8.0)

## 2022-10-04 MED ORDER — CEPHALEXIN 500 MG PO CAPS: 500.0000 mg | ORAL_CAPSULE | Freq: Two times a day (BID) | ORAL | 0 refills | Status: DC

## 2022-10-04 NOTE — Discharge Instructions (Addendum)

## 2022-10-04 NOTE — ED Triage Notes (Signed)
Pt reports burning when urinating and increase urinary frequency since last night.  

## 2022-10-04 NOTE — ED Provider Notes (Signed)
[facility]  Note:  This document was prepared using Conservation officer, historic buildings and may include unintentional dictation errors.  MRN: [id number] DOB: 1940-10-01  Subjective:   Kristen Mendoza is a 82 y.o. female presenting for 1 day history of dysuria, urinary frequency and urgency. She did take Azo for symptomatic relief. Has a history of UTIs. Also has dementia but is able to stay hydrated.  She does limit her urinary irritants.  No fever, nausea, vomiting, vaginal discharge.  Plan is to contact their urologist first thing tomorrow.  No current facility-administered medications for this encounter.  Current Outpatient Medications:    DUPIXENT 300 MG/2ML SOPN, Inject into the skin., Disp: , Rfl:    acetaminophen (TYLENOL) 650 MG CR tablet, Take 650 mg by mouth every 8 (eight) hours as needed for pain or fever., Disp: , Rfl:    amLODipine (NORVASC) 5 MG tablet, Take 1 tablet (5 mg total) by mouth daily., Disp: 90 tablet, Rfl: 3   hydrOXYzine (ATARAX) 10 MG tablet, Take 10-20 mg by mouth at bedtime as needed., Disp: , Rfl:    olmesartan (BENICAR) 20 MG tablet, Take 1 tablet (20 mg total) by mouth daily., Disp: 90 tablet, Rfl: 3   triamcinolone cream (KENALOG) 0.1 %, Apply 1 Application topically 2 (two) times daily., Disp: , Rfl:    Vibegron (GEMTESA) 75 MG TABS, Take 1 tablet (75 mg total) by mouth daily., Disp: 30 tablet, Rfl: 11   No Known Allergies  Past Medical History:  Diagnosis Date   Allergic rhinitis 04/19/2010   Overview:  Allergic Rhinitis  10/1 IMO update   Anxiety state 07/21/2010   Overview:  Anxiety Disorder NOS  10/1 IMO update   Arthritis    right knee   Asthma    dx'd years ago but no current issues    Cancer ([facility])    endometrial cancer-14 yeras ago- surgery only   Hypertension    Vitamin D deficiency 07/21/2010   Overview:  Vitamin D Deficiency  10/1 IMO update     Past Surgical History:  Procedure Laterality Date   ABDOMINAL  HYSTERECTOMY     BLADDER SURGERY     bladder tack    COLONOSCOPY  11-25-2008   polyps, tics, hems- TA x 1-perry    knee cap dislocation     40+ years ago   POLYPECTOMY      Family History  Problem Relation Age of Onset   Colon cancer Neg Hx    Colon polyps Neg Hx    Esophageal cancer Neg Hx    Rectal cancer Neg Hx    Stomach cancer Neg Hx    Breast cancer Neg Hx     Social History   Tobacco Use   Smoking status: Never   Smokeless tobacco: Never  Vaping Use   Vaping Use: Never used  Substance Use Topics   Alcohol use: No    Alcohol/week: 0.0 standard drinks of alcohol   Drug use: No    ROS   Objective:   Vitals: BP (!) 179/71 (BP Location: Left Arm)   Pulse 93   Temp 97.9 F (36.6 C) (Oral)   Resp 16   SpO2 93%   Physical Exam Constitutional:      General: She is not in acute distress.    Appearance: Normal appearance. She is well-developed. She is not ill-appearing, toxic-appearing or diaphoretic.  HENT:     Head: Normocephalic and atraumatic.  Nose: Nose normal.     Mouth/Throat:     Mouth: Mucous membranes are moist.  Eyes:     General: No scleral icterus.       Right eye: No discharge.        Left eye: No discharge.     Extraocular Movements: Extraocular movements intact.     Conjunctiva/sclera: Conjunctivae normal.  Cardiovascular:     Rate and Rhythm: Normal rate.  Pulmonary:     Effort: Pulmonary effort is normal.  Abdominal:     General: Bowel sounds are normal. There is no distension.     Palpations: Abdomen is soft. There is no mass.     Tenderness: There is no abdominal tenderness. There is no right CVA tenderness, left CVA tenderness, guarding or rebound.  Skin:    General: Skin is warm and dry.  Neurological:     General: No focal deficit present.     Mental Status: She is alert and oriented to person, place, and time.  Psychiatric:        Mood and Affect: Mood normal.        Behavior: Behavior normal.        Thought Content:  Thought content normal.        Judgment: Judgment normal.     Results for orders placed or performed during the hospital encounter of 10/04/22 (from the past 24 hour(s))  POCT urinalysis dipstick     Status: Abnormal   Collection Time: 10/04/22  7:13 PM  Result Value Ref Range   Color, UA orange (A) yellow   Clarity, UA clear clear   Glucose, UA =100 (A) negative mg/dL   Bilirubin, UA negative negative   Ketones, POC UA negative negative mg/dL   Spec Grav, UA <=4.132 (A) 1.010 - 1.025   Blood, UA trace-intact (A) negative   pH, UA 5.5 5.0 - 8.0   Protein Ur, POC negative negative mg/dL   Urobilinogen, UA 1.0 0.2 or 1.0 E.U./dL   Nitrite, UA Positive (A) Negative   Leukocytes, UA Small (1+) (A) Negative    Assessment and Plan :   PDMP not reviewed this encounter.  1. Acute cystitis with hematuria   2. Dysuria   3. Urinary frequency    Start Keflex to cover for acute cystitis, urine culture pending.  Recommended aggressive hydration, limiting urinary irritants. Counseled patient on potential for adverse effects with medications prescribed/recommended today, ER and return-to-clinic precautions discussed, patient verbalized understanding.    Wallis Bamberg, [place] 10/04/22 [id number]

## 2022-10-05 ENCOUNTER — Encounter: Payer: Self-pay | Admitting: Urology

## 2022-10-05 LAB — URINE CULTURE: Culture: NO GROWTH

## 2022-10-08 NOTE — Telephone Encounter (Signed)
LMOM for daughter Blima Ledger to return call to R/S July appt for 1-2 weeks out.

## 2022-10-17 ENCOUNTER — Encounter: Payer: Self-pay | Admitting: Urology

## 2022-10-17 ENCOUNTER — Ambulatory Visit: Payer: Medicare PPO | Admitting: Urology

## 2022-10-17 VITALS — BP 145/77 | HR 81 | Ht 62.0 in | Wt 159.0 lb

## 2022-10-17 DIAGNOSIS — N3941 Urge incontinence: Secondary | ICD-10-CM

## 2022-10-17 NOTE — Addendum Note (Signed)
Addended by: Lizbeth Bark on: 10/17/2022 03:13 PM   Modules accepted: Orders

## 2022-10-17 NOTE — Progress Notes (Signed)
Assessment: 1. Urge incontinence     Plan: Continue bladder diet  I discussed options for management of her urge incontinence with other medications such as Myrbetriq and posterior tibial nerve stimulation.  She does have some concerns about the potential side effects of treatment. Information on PTNS provided Return to office in 3 months  Chief Complaint:  Chief Complaint  Patient presents with   Urinary Incontinence    History of Present Illness:  Kristen Mendoza is a 82 y.o. female who is seen for continued evaluation of urinary incontinence. At her initial visit in 4/24, she reported symptoms x 18 months.  Symptoms include urinary frequency, voiding every 2-3 hours during the day and nocturia every 1-2 hours.  She also noted urinary urgency and associated incontinence, primarily at night.  No dysuria or gross hematuria.   She was not having any problems with fecal incontinence.  No constipation. No prior treatment for her incontinence symptoms.  She is status post a total abdominal hysterectomy for endometrial cancer in 2003.  She underwent a bladder suspension in 1974 in [place].  Urine culture from 08/01/2022 showed <10K gm + organism.   She was given a trial of Gemtesa 75 mg daily at her visit in 4/24.  At her visit on 09/03/2022, she continued on Gemtesa 75 mg daily.  She had noted improvement in her incontinence with the medication.  She noted slight difficulty with emptying her bladder in the morning.  This improved throughout the day.  No dysuria or gross hematuria. She continued on [place].  She presented to the emergency room on 10/04/2022 with urea, frequency, and urgency.  Urinalysis showed small leukocytes and was nitrite positive.  Urine culture showed no growth.  She was initially treated with antibiotics.  She noted improvement in her urinary symptoms following the antibiotics.  She discontinued the [place] as well.  She feels like she is able to empty her bladder  more completely off of the medication.  She continues to have urgency and urge incontinence.  Portions of the above documentation were copied from a prior visit for review purposes only.   Past Medical History:  Past Medical History:  Diagnosis Date   Allergic rhinitis 04/19/2010   Overview:  Allergic Rhinitis  10/1 IMO update   Anxiety state 07/21/2010   Overview:  Anxiety Disorder NOS  10/1 IMO update   Arthritis    right knee   Asthma    dx'd years ago but no current issues    Cancer (HCC)    endometrial cancer-14 yeras ago- surgery only   Hypertension    Vitamin D deficiency 07/21/2010   Overview:  Vitamin D Deficiency  10/1 IMO update    Past Surgical History:  Past Surgical History:  Procedure Laterality Date   ABDOMINAL HYSTERECTOMY     BLADDER SURGERY     bladder tack    COLONOSCOPY  11-25-2008   polyps, tics, hems- TA x 1-perry    knee cap dislocation     40+ years ago   POLYPECTOMY      Allergies:  No Known Allergies  Family History:  Family History  Problem Relation Age of Onset   Colon cancer Neg Hx    Colon polyps Neg Hx    Esophageal cancer Neg Hx    Rectal cancer Neg Hx    Stomach cancer Neg Hx    Breast cancer Neg Hx     Social History:  Social History   Tobacco Use  Smoking status: Never   Smokeless tobacco: Never  Vaping Use   Vaping Use: Never used  Substance Use Topics   Alcohol use: No    Alcohol/week: 0.0 standard drinks of alcohol   Drug use: No    ROS: Constitutional:  Negative for fever, chills, weight loss CV: Negative for chest pain, previous MI, hypertension Respiratory:  Negative for shortness of breath, wheezing, sleep apnea, frequent cough GI:  Negative for nausea, vomiting, bloody stool, GERD  Physical exam: BP (!) 145/77   Pulse 81   Ht 5\' 2"  (1.575 m)   Wt 159 lb (72.1 kg)   BMI 29.08 kg/m  GENERAL APPEARANCE:  Well appearing, well developed, well nourished, NAD HEENT:  Atraumatic, normocephalic, oropharynx  clear NECK:  Supple without lymphadenopathy or thyromegaly ABDOMEN:  Soft, non-tender, no masses EXTREMITIES:  Moves all extremities well, without clubbing, cyanosis, or edema NEUROLOGIC:  Alert and oriented x 3, normal gait, CN II-XII grossly intact MENTAL STATUS:  appropriate BACK:  Non-tender to palpation, No CVAT SKIN:  Warm, dry, and intact GU: Urethra: well supported; no masses; no discharge; no leakage with cough or valsalva Vagina: normal mucosa; grade 2-3 cystocele  A chaperone was present during the examination.   Results: U/A:  negative  I&O cath:  60 ml

## 2022-10-18 ENCOUNTER — Encounter: Payer: Self-pay | Admitting: Urology

## 2022-10-19 ENCOUNTER — Telehealth: Payer: Self-pay

## 2022-10-19 NOTE — Telephone Encounter (Signed)
Spoke w Annett Gula., at [facility]. Patient has been approved for PTNS. Benefits: No deductible, max out of pocket is $4000 of which $230 has been met. A $40 co pay will be expected at each visit. Approved for 12 treatments. Auth # [id number].

## 2022-10-24 LAB — URINALYSIS, ROUTINE W REFLEX MICROSCOPIC
Bilirubin, UA: NEGATIVE
Glucose, UA: NEGATIVE
Ketones, UA: NEGATIVE
Leukocytes,UA: NEGATIVE
Nitrite, UA: NEGATIVE
Protein,UA: NEGATIVE
RBC, UA: NEGATIVE
Specific Gravity, UA: 1.015 (ref 1.005–1.030)
Urobilinogen, Ur: 0.2 mg/dL (ref 0.2–1.0)
pH, UA: 5.5 (ref 5.0–7.5)

## 2022-10-25 DIAGNOSIS — L2089 Other atopic dermatitis: Secondary | ICD-10-CM | POA: Diagnosis not present

## 2022-10-29 ENCOUNTER — Encounter: Payer: Self-pay | Admitting: Internal Medicine

## 2022-11-14 ENCOUNTER — Encounter: Payer: Self-pay | Admitting: Urology

## 2022-11-14 ENCOUNTER — Ambulatory Visit: Payer: Medicare PPO | Admitting: Urology

## 2022-11-14 DIAGNOSIS — N3941 Urge incontinence: Secondary | ICD-10-CM

## 2022-11-14 NOTE — Patient Instructions (Signed)
Tracking Your Bladder Symptoms   Patient Name:___________________________________________________  Week Starting:____________________________________  Day Daytime  Voids Nighttime  Voids Urgency for the Day (none, mild, strong, severe) Number of Accidents/ Leaks Beverages Comments                                                           This week my symptoms were:  O much better O better O the same O worse

## 2022-11-14 NOTE — Progress Notes (Signed)
PTNS  Session # 1  Health & Social Factors: none Caffeine: 0 Alcohol: 0 Daytime voids #per day: 5-6 Night-time voids #per night: 4 Urgency: Severe Incontinence Episodes #per day: 1 Ankle used: Right Treatment Setting: 18 Feeling/ Response: Sensory Comments: Pt tolerated well, no complications were noted.   Performed By: Jimmye Norman. CMA  Follow Up: RTC in one week for PTNS #2.

## 2022-11-21 ENCOUNTER — Ambulatory Visit: Payer: Medicare PPO | Admitting: Urology

## 2022-11-21 ENCOUNTER — Encounter: Payer: Self-pay | Admitting: Urology

## 2022-11-21 DIAGNOSIS — N3941 Urge incontinence: Secondary | ICD-10-CM | POA: Diagnosis not present

## 2022-11-21 NOTE — Progress Notes (Signed)
PTNS  Session # 2  Health & Social Factors: no changes Caffeine: 0 Alcohol: 0 Daytime voids #per day: 6 Night-time voids #per night: 3-4 Urgency: Strong Incontinence Episodes #per day: 0-1 Ankle used: Left Treatment Setting: 18 Feeling/ Response: Toe flex and sensory Comments: Pt tolerated well, no complications noted.   Performed By: Jimmye Norman., CMA  Follow Up: RTC in 1 week for PTNS #3.

## 2022-11-21 NOTE — Patient Instructions (Signed)
Tracking Your Bladder Symptoms   Patient Name:___________________________________________________  Week Starting:____________________________________   Day Daytime  Voids Nighttime  Voids Urgency for the Day (none, mild, strong, severe) Number of Accidents/ Leaks Beverages Comments  Monday        Tuesday        Wednesday        Thursday        Friday        Saturday        Sunday         This week my symptoms were:  O much better O better O the same O worse   

## 2022-11-21 NOTE — Progress Notes (Deleted)
PTNS  Session # 2  Health & Social Factors: no changes Caffeine: 0 Alcohol: 0 Daytime voids #per day: 6 Night-time voids #per night: 3-4 Urgency: Strong Incontinence Episodes #per day: 0-1 Ankle used: Left Treatment Setting: 18 Feeling/ Response: Toe flex and sensory Comments: Pt tolerated well, no complications noted.   Performed By: Jimmye Norman., CMA  Follow Up: RTC in 1 week for PTNS #3.

## 2022-11-28 ENCOUNTER — Ambulatory Visit: Payer: Medicare PPO | Admitting: Urology

## 2022-11-28 ENCOUNTER — Encounter: Payer: Self-pay | Admitting: Urology

## 2022-11-28 DIAGNOSIS — N3941 Urge incontinence: Secondary | ICD-10-CM | POA: Diagnosis not present

## 2022-11-28 NOTE — Progress Notes (Signed)
PTNS  Session # 3  Health & Social Factors: No changes Caffeine: 1 Alcohol: 0 Daytime voids #per day: 4 Night-time voids #per night: 3 Urgency: Mild Incontinence Episodes #per day: 0 Ankle used: Right Treatment Setting: 18 Feeling/ Response: Toe flex & sensory Comments: Pt notes improvement in symptoms, specifically her urgency.  Performed By: Jimmye Norman., CMA  Follow Up: RTC in 1 week for PTNS #4.

## 2022-11-28 NOTE — Patient Instructions (Signed)
Tracking Your Bladder Symptoms   Patient Name:___________________________________________________  Week Starting:____________________________________   Day Daytime  Voids Nighttime  Voids Urgency for the Day (none, mild, strong, severe) Number of Accidents/ Leaks Beverages Comments  Monday        Tuesday        Wednesday        Thursday        Friday        Saturday        Sunday         This week my symptoms were:  O much better O better O the same O worse   

## 2022-12-03 ENCOUNTER — Ambulatory Visit: Payer: Medicare PPO | Admitting: Urology

## 2022-12-05 ENCOUNTER — Ambulatory Visit: Payer: Medicare PPO | Admitting: Urology

## 2022-12-05 ENCOUNTER — Encounter: Payer: Self-pay | Admitting: Urology

## 2022-12-05 DIAGNOSIS — N3941 Urge incontinence: Secondary | ICD-10-CM | POA: Diagnosis not present

## 2022-12-05 NOTE — Patient Instructions (Signed)
Tracking Your Bladder Symptoms   Patient Name:___________________________________________________  Week Starting:____________________________________   Day Daytime  Voids Nighttime  Voids Urgency for the Day (none, mild, strong, severe) Number of Accidents/ Leaks Beverages Comments  Monday        Tuesday        Wednesday        Thursday        Friday        Saturday        Sunday         This week my symptoms were:  O much better O better O the same O worse   

## 2022-12-05 NOTE — Progress Notes (Signed)
PTNS  Session # 4  Health & Social Factors: No changes Caffeine: 1 Alcohol: 0 Daytime voids #per day: 4 Night-time voids #per night: 2 Urgency: Mild Incontinence Episodes #per day: 0 Ankle used: Left Treatment Setting: 18 Feeling/ Response: Toe flex & sensory Comments: Pt tolerated well, no complications noted.   Performed By: Arville Go CMA  Follow Up: RTC in 1 week for PTNS #5

## 2022-12-12 ENCOUNTER — Ambulatory Visit: Payer: Medicare PPO | Admitting: Urology

## 2022-12-12 ENCOUNTER — Encounter: Payer: Self-pay | Admitting: Urology

## 2022-12-12 DIAGNOSIS — N3941 Urge incontinence: Secondary | ICD-10-CM

## 2022-12-12 NOTE — Patient Instructions (Signed)
Tracking Your Bladder Symptoms   Patient Name:___________________________________________________  Week Starting:____________________________________   Day Daytime  Voids Nighttime  Voids Urgency for the Day (none, mild, strong, severe) Number of Accidents/ Leaks Beverages Comments  Monday        Tuesday        Wednesday        Thursday        Friday        Saturday        Sunday         This week my symptoms were:  O much better O better O the same O worse   

## 2022-12-12 NOTE — Progress Notes (Signed)
PTNS  Session # 5  Health & Social Factors: No changes Caffeine: 1 Alcohol: 0 Daytime voids #per day: 4 Night-time voids #per night: 2 Urgency: Mild Incontinence Episodes #per day: 0 Ankle used: Right Treatment Setting: 18 Feeling/ Response: Toe flex & sensory Comments: Pt notes continued improvement in urinary symptoms  Performed By: Jimmye Norman. CMA  Follow Up: RTC in 1 week for PTNS #6.

## 2022-12-19 ENCOUNTER — Encounter: Payer: Self-pay | Admitting: Urology

## 2022-12-19 ENCOUNTER — Ambulatory Visit: Payer: Medicare PPO | Admitting: Urology

## 2022-12-19 DIAGNOSIS — N3941 Urge incontinence: Secondary | ICD-10-CM

## 2022-12-19 NOTE — Progress Notes (Signed)
PTNS  Session # 6  Health & Social Factors: No changes Caffeine: 1 Alcohol: 0 Daytime voids #per day: 5 Night-time voids #per night: 2 Urgency: Mild Incontinence Episodes #per day: 0 Ankle used: Left Treatment Setting: 4 Feeling/ Response: Toe flex & sensory Comments: Pt continues to note improvement in her urinary symptoms each week.   Performed By: Arville Go CMA  Follow Up: RTC in 1 week for PTNS #7.

## 2022-12-19 NOTE — Patient Instructions (Signed)
Tracking Your Bladder Symptoms   Patient Name:___________________________________________________  Week Starting:____________________________________   Day Daytime  Voids Nighttime  Voids Urgency for the Day (none, mild, strong, severe) Number of Accidents/ Leaks Beverages Comments  Monday        Tuesday        Wednesday        Thursday        Friday        Saturday        Sunday         This week my symptoms were:  O much better O better O the same O worse   

## 2022-12-20 ENCOUNTER — Encounter (INDEPENDENT_AMBULATORY_CARE_PROVIDER_SITE_OTHER): Payer: Self-pay

## 2022-12-26 ENCOUNTER — Ambulatory Visit: Payer: Medicare PPO | Admitting: Urology

## 2022-12-26 ENCOUNTER — Encounter: Payer: Self-pay | Admitting: Urology

## 2022-12-26 DIAGNOSIS — N3941 Urge incontinence: Secondary | ICD-10-CM

## 2022-12-26 NOTE — Patient Instructions (Signed)
Tracking Your Bladder Symptoms   Patient Name:___________________________________________________  Week Starting:____________________________________   Day Daytime  Voids Nighttime  Voids Urgency for the Day (none, mild, strong, severe) Number of Accidents/ Leaks Beverages Comments  Monday        Tuesday        Wednesday        Thursday        Friday        Saturday        Sunday         This week my symptoms were:  O much better O better O the same O worse   

## 2022-12-26 NOTE — Progress Notes (Signed)
PTNS  Session # 7  Health & Social Factors: no change Caffeine: 1 Alcohol: 0 Daytime voids #per day: 5-7 Night-time voids #per night: 3-4 Urgency: mild Incontinence Episodes #per day: 0 Ankle used: right Treatment Setting: 8 Feeling/ Response: toe flex and sensory  Performed By: Idelle Crouch., CMA(AAMA)   Follow Up: 1 week

## 2022-12-29 ENCOUNTER — Encounter: Payer: Self-pay | Admitting: Internal Medicine

## 2022-12-31 NOTE — Telephone Encounter (Signed)
Ok will try to address

## 2023-01-02 ENCOUNTER — Ambulatory Visit: Payer: Medicare PPO | Admitting: Urology

## 2023-01-02 ENCOUNTER — Encounter: Payer: Self-pay | Admitting: Urology

## 2023-01-02 DIAGNOSIS — N3941 Urge incontinence: Secondary | ICD-10-CM | POA: Diagnosis not present

## 2023-01-02 NOTE — Progress Notes (Signed)
PTNS  Session # 8  Health & Social Factors: No changes Caffeine: 1 Alcohol: 0 Daytime voids #per day: 6 Night-time voids #per night: 3 Urgency: Strong Incontinence Episodes #per day: 0 Ankle used: Left Treatment Setting: 15 Feeling/ Response: Toe flex and sensory Comments: Pt tolerated well, no complications were noted.   Performed By: Arville Go CMA  Follow Up: RTC in 1 week for PTNS #9

## 2023-01-02 NOTE — Patient Instructions (Signed)
Tracking Your Bladder Symptoms   Patient Name:___________________________________________________  Week Starting:____________________________________   Day Daytime  Voids Nighttime  Voids Urgency for the Day (none, mild, strong, severe) Number of Accidents/ Leaks Beverages Comments  Monday        Tuesday        Wednesday        Thursday        Friday        Saturday        Sunday         This week my symptoms were:  O much better O better O the same O worse   

## 2023-01-09 ENCOUNTER — Encounter: Payer: Self-pay | Admitting: Urology

## 2023-01-09 ENCOUNTER — Ambulatory Visit: Payer: Medicare PPO | Admitting: Urology

## 2023-01-09 DIAGNOSIS — N3941 Urge incontinence: Secondary | ICD-10-CM

## 2023-01-09 NOTE — Progress Notes (Signed)
PTNS  Session # 9  Health & Social Factors: No changes Caffeine: 1 Alcohol: 0 Daytime voids #per day: 5 Night-time voids #per night: 3 Urgency: Mild Incontinence Episodes #per day: 1 Ankle used: Right Treatment Setting: 17 Feeling/ Response: Sensory Comments: Pt notes very little improvement in urinary symptoms.  Performed By: Arville Go CMA  Follow Up: RTC in 1 week for PTNS #10.

## 2023-01-09 NOTE — Patient Instructions (Signed)
Tracking Your Bladder Symptoms   Patient Name:___________________________________________________  Week Starting:____________________________________   Day Daytime  Voids Nighttime  Voids Urgency for the Day (none, mild, strong, severe) Number of Accidents/ Leaks Beverages Comments  Monday        Tuesday        Wednesday        Thursday        Friday        Saturday        Sunday         This week my symptoms were:  O much better O better O the same O worse   

## 2023-01-16 ENCOUNTER — Encounter: Payer: Self-pay | Admitting: Urology

## 2023-01-16 ENCOUNTER — Ambulatory Visit: Payer: Medicare PPO | Admitting: Urology

## 2023-01-16 DIAGNOSIS — N3941 Urge incontinence: Secondary | ICD-10-CM | POA: Diagnosis not present

## 2023-01-16 NOTE — Patient Instructions (Signed)
Tracking Your Bladder Symptoms   Patient Name:___________________________________________________  Week Starting:____________________________________   Day Daytime  Voids Nighttime  Voids Urgency for the Day (none, mild, strong, severe) Number of Accidents/ Leaks Beverages Comments  Monday        Tuesday        Wednesday        Thursday        Friday        Saturday        Sunday         This week my symptoms were:  O much better O better O the same O worse   

## 2023-01-16 NOTE — Progress Notes (Signed)
PTNS  Session # 10  Health & Social Factors: No changes Caffeine: 1 Alcohol: 0 Daytime voids #per day: 5 Night-time voids #per night: 3-4 Urgency: Mild Incontinence Episodes #per day: 0 Ankle used: Left Treatment Setting: 19 Feeling/ Response: Toe flex Comments: Pt states her nighttime voids have increased however her urgency is still mild.   Performed By: Jimmye Norman. CMA  Follow Up: RTC in 1 week for PTNS #11.

## 2023-01-17 ENCOUNTER — Ambulatory Visit: Payer: Medicare PPO | Admitting: Urology

## 2023-01-23 ENCOUNTER — Encounter: Payer: Self-pay | Admitting: Urology

## 2023-01-23 ENCOUNTER — Other Ambulatory Visit: Payer: Self-pay

## 2023-01-23 ENCOUNTER — Ambulatory Visit: Payer: Medicare PPO | Admitting: Urology

## 2023-01-23 ENCOUNTER — Other Ambulatory Visit: Payer: Self-pay | Admitting: Internal Medicine

## 2023-01-23 DIAGNOSIS — N3941 Urge incontinence: Secondary | ICD-10-CM

## 2023-01-23 NOTE — Patient Instructions (Signed)
Tracking Your Bladder Symptoms   Patient Name:___________________________________________________  Week Starting:____________________________________   Day Daytime  Voids Nighttime  Voids Urgency for the Day (none, mild, strong, severe) Number of Accidents/ Leaks Beverages Comments  Monday        Tuesday        Wednesday        Thursday        Friday        Saturday        Sunday         This week my symptoms were:  O much better O better O the same O worse

## 2023-01-23 NOTE — Progress Notes (Signed)
PTNS  Session # 11  Health & Social Factors: No changes Caffeine: 1 Alcohol: 0 Daytime voids #per day: 4-5 Night-time voids #per night: 2 Urgency: mild Incontinence Episodes #per day: 0 Ankle used: Left Treatment Setting: 9 Feeling/ Response: Toe flex and sensory Comments: N/A  Performed By: Idelle Crouch, CMA  Follow Up: RTC in 1 week for PTNS #12.

## 2023-01-30 ENCOUNTER — Encounter: Payer: Self-pay | Admitting: Urology

## 2023-01-30 ENCOUNTER — Ambulatory Visit: Payer: Medicare PPO | Admitting: Urology

## 2023-01-30 DIAGNOSIS — N3941 Urge incontinence: Secondary | ICD-10-CM | POA: Diagnosis not present

## 2023-01-30 NOTE — Progress Notes (Signed)
PTNS  Session # 12  Health & Social Factors: No changes Caffeine: 1 Alcohol: 0 Daytime voids #per day: 6 Night-time voids #per night: 3 Urgency: Mild Incontinence Episodes #per day: 0 Ankle used: Left Treatment Setting: 15 Feeling/ Response: Toe flex and sensory Comments: Pt notes mild improvements overall after 12 treatments. Pt scheduled to follow up with Dr next week.  Performed By: Arville Go CMA

## 2023-02-01 ENCOUNTER — Ambulatory Visit: Payer: Medicare PPO | Admitting: Internal Medicine

## 2023-02-01 ENCOUNTER — Encounter: Payer: Self-pay | Admitting: Internal Medicine

## 2023-02-01 VITALS — BP 136/70 | HR 80 | Temp 98.3°F | Ht 62.0 in | Wt 154.0 lb

## 2023-02-01 DIAGNOSIS — F03A4 Unspecified dementia, mild, with anxiety: Secondary | ICD-10-CM | POA: Diagnosis not present

## 2023-02-01 DIAGNOSIS — E7800 Pure hypercholesterolemia, unspecified: Secondary | ICD-10-CM | POA: Diagnosis not present

## 2023-02-01 DIAGNOSIS — E559 Vitamin D deficiency, unspecified: Secondary | ICD-10-CM

## 2023-02-01 DIAGNOSIS — Z23 Encounter for immunization: Secondary | ICD-10-CM

## 2023-02-01 DIAGNOSIS — I10 Essential (primary) hypertension: Secondary | ICD-10-CM

## 2023-02-01 NOTE — Patient Instructions (Addendum)
You had the flu shot today  Please continue all other medications as before, and refills have been done if requested.  Please have the pharmacy call with any other refills you may need.  Please continue your efforts at being more active, low cholesterol diet, and weight control.  Please keep your appointments with your specialists as you may have planned - urology  Please make an Appointment to return in 6 months, or sooner if needed

## 2023-02-01 NOTE — Progress Notes (Unsigned)
Patient ID: Kristen Mendoza, female   DOB: 01-14-1941, 82 y.o.   MRN: [id number]        Chief Complaint: follow up low vit d, hld, gait disorder, dementia       HPI:  Kristen Mendoza is a 82 y.o. female here overall doing ok,  Pt denies chest pain, increased sob or doe, wheezing, orthopnea, PND, increased LE swelling, palpitations, dizziness or syncope.   Pt denies polydipsia, polyuria, or new focal neuro s/s.    Pt denies fever, wt loss, night sweats, loss of appetite, or other constitutional symptoms  No falls, suing walker for out of the home mostly.  Does not want statin today.  Not taking Vit D.   Had to stop the gemtesa due to urinary retention, did also have UTI at [facility].  Did try a type of nerve stimulation per urology but not help.  Declines to take statin or other med for lipids now.   Wt Readings from Last 3 Encounters:  02/01/23 154 lb (69.9 kg)  10/17/22 159 lb (72.1 kg)  09/03/22 159 lb (72.1 kg)   BP Readings from Last 3 Encounters:  02/01/23 136/70  10/17/22 (!) 145/77  10/04/22 (!) 179/71         Past Medical History:  Diagnosis Date   Allergic rhinitis 04/19/2010   Overview:  Allergic Rhinitis  10/1 IMO update   Anxiety state 07/21/2010   Overview:  Anxiety Disorder NOS  10/1 IMO update   Arthritis    right knee   Asthma    dx'd years ago but no current issues    Cancer (HCC)    endometrial cancer-14 yeras ago- surgery only   Hypertension    Vitamin D deficiency 07/21/2010   Overview:  Vitamin D Deficiency  10/1 IMO update   Past Surgical History:  Procedure Laterality Date   ABDOMINAL HYSTERECTOMY     BLADDER SURGERY     bladder tack    COLONOSCOPY  11-25-2008   polyps, tics, hems- TA x 1-perry    knee cap dislocation     40+ years ago   POLYPECTOMY      reports that she has never smoked. She has never used smokeless tobacco. She reports that she does not drink alcohol and does not use drugs. family history is not on file. No Known Allergies Current Outpatient  Medications on File Prior to Visit  Medication Sig Dispense Refill   acetaminophen (TYLENOL) 650 MG CR tablet Take 650 mg by mouth every 8 (eight) hours as needed for pain or fever.     amLODipine (NORVASC) 5 MG tablet Take 1 tablet (5 mg total) by mouth daily. 90 tablet 3   DUPIXENT 300 MG/2ML SOPN Inject into the skin.     hydrOXYzine (ATARAX) 10 MG tablet Take 10-20 mg by mouth at bedtime as needed.     olmesartan (BENICAR) 20 MG tablet TAKE 1 TABLET BY MOUTH EVERY DAY 90 tablet 3   triamcinolone cream (KENALOG) 0.1 % Apply 1 Application topically 2 (two) times daily.     No current facility-administered medications on file prior to visit.        ROS:  All others reviewed and negative.  Objective        PE:  BP 136/70 (BP Location: Right Arm, Patient Position: Sitting, Cuff Size: Normal)   Pulse 80   Temp 98.3 F (36.8 C) (Oral)   Ht 5\' 2"  (1.575 m)   Wt 154 lb (69.9 kg)  SpO2 99%   BMI 28.17 kg/m                 Constitutional: Pt appears in NAD               HENT: Head: NCAT.                Right Ear: External ear normal.                 Left Ear: External ear normal.                Eyes: . Pupils are equal, round, and reactive to light. Conjunctivae and EOM are normal               Nose: without d/c or deformity               Neck: Neck supple. Gross normal ROM               Cardiovascular: Normal rate and regular rhythm.                 Pulmonary/Chest: Effort normal and breath sounds without rales or wheezing.                Abd:  Soft, NT, ND, + BS, no organomegaly               Neurological: Pt is alert. At baseline orientation, motor grossly intact               Skin: Skin is warm. No rashes, no other new lesions, LE edema - none               Psychiatric: Pt behavior is normal without agitation   Micro: none  Cardiac tracings I have personally interpreted today:  none  Pertinent Radiological findings (summarize): none   Lab Results  Component Value Date    WBC 11.9 (H) 08/01/2022   HGB 13.3 08/01/2022   HCT 40.0 08/01/2022   PLT 384.0 08/01/2022   GLUCOSE 103 (H) 08/01/2022   CHOL 202 (H) 08/01/2022   TRIG 77.0 08/01/2022   HDL 76.30 08/01/2022   LDLCALC 110 (H) 08/01/2022   ALT 15 08/01/2022   AST 20 08/01/2022   NA 138 08/01/2022   K 4.5 08/01/2022   CL 102 08/01/2022   CREATININE 0.89 08/01/2022   BUN 25 (H) 08/01/2022   CO2 27 08/01/2022   TSH 2.70 08/01/2022   HGBA1C 5.9 08/01/2022   MICROALBUR 2.9 (H) 08/01/2022   Assessment/Plan:  Kristen Mendoza is a 82 y.o. White or Caucasian [1] female with  has a past medical history of Allergic rhinitis (04/19/2010), Anxiety state (07/21/2010), Arthritis, Asthma, Cancer (HCC), Hypertension, and Vitamin D deficiency (07/21/2010).  Dementia (HCC) Stable overal, declines med such as aricept  Hypertension BP Readings from Last 3 Encounters:  02/01/23 136/70  10/17/22 (!) 145/77  10/04/22 (!) 179/71   Stable, pt to continue medical treatment norvasc 5 every day, benicar 20 qd   Vitamin D deficiency Last vitamin D Lab Results  Component Value Date   VD25OH 24.48 (L) 08/01/2022   Low to start oral replacement   HLD (hyperlipidemia) Lab Results  Component Value Date   LDLCALC 110 (H) 08/01/2022   Uncontrolled, goal ldl < 100, pt declines statin or other lipid tx  Followup: Return in about 6 months (around 08/01/2023).  Oliver Barre, MD 02/03/2023 2:55 PM [facility]  Primary Care - [facility]  Internal Medicine

## 2023-02-03 ENCOUNTER — Encounter: Payer: Self-pay | Admitting: Internal Medicine

## 2023-02-03 DIAGNOSIS — E785 Hyperlipidemia, unspecified: Secondary | ICD-10-CM | POA: Insufficient documentation

## 2023-02-03 NOTE — Assessment & Plan Note (Signed)
Stable overal, declines med such as aricept

## 2023-02-03 NOTE — Assessment & Plan Note (Signed)
BP Readings from Last 3 Encounters:  02/01/23 136/70  10/17/22 (!) 145/77  10/04/22 (!) 179/71   Stable, pt to continue medical treatment norvasc 5 every day, benicar 20 qd

## 2023-02-03 NOTE — Assessment & Plan Note (Signed)
Lab Results  Component Value Date   LDLCALC 110 (H) 08/01/2022   Uncontrolled, goal ldl < 100, pt declines statin or other lipid tx

## 2023-02-03 NOTE — Assessment & Plan Note (Signed)
Last vitamin D Lab Results  Component Value Date   VD25OH 24.48 (L) 08/01/2022   Low to start oral replacement

## 2023-02-06 ENCOUNTER — Ambulatory Visit: Payer: Medicare PPO | Admitting: Urology

## 2023-02-06 ENCOUNTER — Encounter: Payer: Self-pay | Admitting: Urology

## 2023-02-06 VITALS — BP 128/81 | HR 76 | Ht 62.0 in | Wt 154.0 lb

## 2023-02-06 DIAGNOSIS — R351 Nocturia: Secondary | ICD-10-CM | POA: Diagnosis not present

## 2023-02-06 DIAGNOSIS — N3941 Urge incontinence: Secondary | ICD-10-CM | POA: Diagnosis not present

## 2023-02-06 MED ORDER — TROSPIUM CHLORIDE 20 MG PO TABS
20.0000 mg | ORAL_TABLET | Freq: Every day | ORAL | 5 refills | Status: AC
Start: 2023-02-06 — End: ?

## 2023-02-06 NOTE — Progress Notes (Signed)
Assessment: 1. Urge incontinence   2. Nocturia     Plan: Continue bladder diet  She has had some improvement in her symptoms following the PTNS but continues to have nocturia which is bothersome for her.  I discussed possible causes of nocturia. Trial of trospium 20 mg nightly.  Use and side effects discussed. I asked patient to call me with results of the medication in approximately 1 month. Return to office in 2 months  Chief Complaint:  Chief Complaint  Patient presents with   Urinary Incontinence    History of Present Illness:  Kristen Mendoza is a 82 y.o. female who is seen for continued evaluation of urinary incontinence. At her initial visit in 4/24, she reported symptoms x 18 months.  Symptoms include urinary frequency, voiding every 2-3 hours during the day and nocturia every 1-2 hours.  She also noted urinary urgency and associated incontinence, primarily at night.  No dysuria or gross hematuria.   She was not having any problems with fecal incontinence.  No constipation. No prior treatment for her incontinence symptoms.  She is status post a total abdominal hysterectomy for endometrial cancer in 2003.  She underwent a bladder suspension in 1974 in [place].  Urine culture from 08/01/2022 showed <10K gm + organism.   She was given a trial of Gemtesa 75 mg daily at her visit in 4/24.  At her visit on 09/03/2022, she continued on Gemtesa 75 mg daily.  She had noted improvement in her incontinence with the medication.  She noted slight difficulty with emptying her bladder in the morning.  This improved throughout the day.  No dysuria or gross hematuria. She continued on [place].  She presented to the emergency room on 10/04/2022 with dysuria, frequency, and urgency.  Urinalysis showed small leukocytes and was nitrite positive.  Urine culture showed no growth.  She was initially treated with antibiotics.  She noted improvement in her urinary symptoms following the antibiotics.   She discontinued the [place] as well.  She felt like she was able to empty her bladder more completely off of the medication.  She continued to have urgency and urge incontinence. Pelvic exam from 6/24 demonstrated a grade 2-3 cystocele. I&O cath: 60 ml  She has received 12 PTNS treatments, completing on 01/30/23.  She returns today for follow-up.  She has noted some improvement in her daytime frequency and urge incontinence.  She continues to have nocturia 3-4 times and occasional nighttime incontinence.  No dysuria or gross hematuria.  Portions of the above documentation were copied from a prior visit for review purposes only.   Past Medical History:  Past Medical History:  Diagnosis Date   Allergic rhinitis 04/19/2010   Overview:  Allergic Rhinitis  10/1 IMO update   Anxiety state 07/21/2010   Overview:  Anxiety Disorder NOS  10/1 IMO update   Arthritis    right knee   Asthma    dx'd years ago but no current issues    Cancer (HCC)    endometrial cancer-14 yeras ago- surgery only   Hypertension    Vitamin D deficiency 07/21/2010   Overview:  Vitamin D Deficiency  10/1 IMO update    Past Surgical History:  Past Surgical History:  Procedure Laterality Date   ABDOMINAL HYSTERECTOMY     BLADDER SURGERY     bladder tack    COLONOSCOPY  11-25-2008   polyps, tics, hems- TA x 1-perry    knee cap dislocation     40+  years ago   POLYPECTOMY      Allergies:  No Known Allergies  Family History:  Family History  Problem Relation Age of Onset   Colon cancer Neg Hx    Colon polyps Neg Hx    Esophageal cancer Neg Hx    Rectal cancer Neg Hx    Stomach cancer Neg Hx    Breast cancer Neg Hx     Social History:  Social History   Tobacco Use   Smoking status: Never   Smokeless tobacco: Never  Vaping Use   Vaping status: Never Used  Substance Use Topics   Alcohol use: No    Alcohol/week: 0.0 standard drinks of alcohol   Drug use: No    ROS: Constitutional:  Negative for  fever, chills, weight loss CV: Negative for chest pain, previous MI, hypertension Respiratory:  Negative for shortness of breath, wheezing, sleep apnea, frequent cough GI:  Negative for nausea, vomiting, bloody stool, GERD  Physical exam: BP 128/81   Pulse 76   Ht 5\' 2"  (1.575 m)   Wt 154 lb (69.9 kg)   BMI 28.17 kg/m  GENERAL APPEARANCE:  Well appearing, well developed, well nourished, NAD HEENT:  Atraumatic, normocephalic, oropharynx clear NECK:  Supple without lymphadenopathy or thyromegaly ABDOMEN:  Soft, non-tender, no masses EXTREMITIES:  Moves all extremities well, without clubbing, cyanosis, or edema NEUROLOGIC:  Alert and oriented x 3, uses walker, CN II-XII grossly intact MENTAL STATUS:  appropriate BACK:  Non-tender to palpation, No CVAT SKIN:  Warm, dry, and intact   Results: No sample provided

## 2023-04-08 ENCOUNTER — Ambulatory Visit: Payer: Medicare PPO | Admitting: Urology

## 2023-04-23 DIAGNOSIS — L718 Other rosacea: Secondary | ICD-10-CM | POA: Diagnosis not present

## 2023-04-23 DIAGNOSIS — L738 Other specified follicular disorders: Secondary | ICD-10-CM | POA: Diagnosis not present

## 2023-04-23 DIAGNOSIS — D485 Neoplasm of uncertain behavior of skin: Secondary | ICD-10-CM | POA: Diagnosis not present

## 2023-04-23 DIAGNOSIS — L2089 Other atopic dermatitis: Secondary | ICD-10-CM | POA: Diagnosis not present

## 2023-07-12 ENCOUNTER — Encounter: Payer: Self-pay | Admitting: Internal Medicine

## 2023-07-15 NOTE — Telephone Encounter (Signed)
 Ok this letter can be done by [employer] to print and send if this is preferred by the patient or family  thanks

## 2023-07-15 NOTE — Telephone Encounter (Signed)
 I have done letter thanks

## 2023-10-21 ENCOUNTER — Other Ambulatory Visit: Payer: Self-pay | Admitting: Internal Medicine

## 2024-01-20 ENCOUNTER — Other Ambulatory Visit: Payer: Self-pay | Admitting: Internal Medicine

## 2024-01-23 ENCOUNTER — Ambulatory Visit: Admitting: Internal Medicine

## 2024-01-23 ENCOUNTER — Ambulatory Visit (INDEPENDENT_AMBULATORY_CARE_PROVIDER_SITE_OTHER)

## 2024-01-23 ENCOUNTER — Ambulatory Visit: Payer: Self-pay | Admitting: Internal Medicine

## 2024-01-23 ENCOUNTER — Encounter: Payer: Self-pay | Admitting: Internal Medicine

## 2024-01-23 VITALS — BP 150/80 | HR 96 | Ht 60.5 in | Wt 149.2 lb

## 2024-01-23 VITALS — BP 150/80 | HR 76 | Ht 60.5 in | Wt 149.0 lb

## 2024-01-23 DIAGNOSIS — Z23 Encounter for immunization: Secondary | ICD-10-CM | POA: Diagnosis not present

## 2024-01-23 DIAGNOSIS — Z780 Asymptomatic menopausal state: Secondary | ICD-10-CM

## 2024-01-23 DIAGNOSIS — E7800 Pure hypercholesterolemia, unspecified: Secondary | ICD-10-CM

## 2024-01-23 DIAGNOSIS — E559 Vitamin D deficiency, unspecified: Secondary | ICD-10-CM

## 2024-01-23 DIAGNOSIS — F03A4 Unspecified dementia, mild, with anxiety: Secondary | ICD-10-CM

## 2024-01-23 DIAGNOSIS — Z0001 Encounter for general adult medical examination with abnormal findings: Secondary | ICD-10-CM

## 2024-01-23 DIAGNOSIS — E538 Deficiency of other specified B group vitamins: Secondary | ICD-10-CM

## 2024-01-23 DIAGNOSIS — J4520 Mild intermittent asthma, uncomplicated: Secondary | ICD-10-CM | POA: Diagnosis not present

## 2024-01-23 DIAGNOSIS — Z0000 Encounter for general adult medical examination without abnormal findings: Secondary | ICD-10-CM | POA: Diagnosis not present

## 2024-01-23 DIAGNOSIS — R739 Hyperglycemia, unspecified: Secondary | ICD-10-CM | POA: Diagnosis not present

## 2024-01-23 DIAGNOSIS — I10 Essential (primary) hypertension: Secondary | ICD-10-CM | POA: Diagnosis not present

## 2024-01-23 LAB — URINALYSIS, ROUTINE W REFLEX MICROSCOPIC
Bilirubin Urine: NEGATIVE
Hgb urine dipstick: NEGATIVE
Leukocytes,Ua: NEGATIVE
Nitrite: NEGATIVE
RBC / HPF: NONE SEEN (ref 0–?)
Specific Gravity, Urine: 1.01 (ref 1.000–1.030)
Total Protein, Urine: NEGATIVE
Urine Glucose: NEGATIVE
Urobilinogen, UA: 0.2 (ref 0.0–1.0)
pH: 6 (ref 5.0–8.0)

## 2024-01-23 LAB — CBC WITH DIFFERENTIAL/PLATELET
Basophils Absolute: 0.1 K/uL (ref 0.0–0.1)
Basophils Relative: 0.5 % (ref 0.0–3.0)
Eosinophils Absolute: 0.1 K/uL (ref 0.0–0.7)
Eosinophils Relative: 1.4 % (ref 0.0–5.0)
HCT: 39.3 % (ref 36.0–46.0)
Hemoglobin: 13.1 g/dL (ref 12.0–15.0)
Lymphocytes Relative: 25.6 % (ref 12.0–46.0)
Lymphs Abs: 2.5 K/uL (ref 0.7–4.0)
MCHC: 33.4 g/dL (ref 30.0–36.0)
MCV: 93.5 fl (ref 78.0–100.0)
Monocytes Absolute: 0.8 K/uL (ref 0.1–1.0)
Monocytes Relative: 8.2 % (ref 3.0–12.0)
Neutro Abs: 6.2 K/uL (ref 1.4–7.7)
Neutrophils Relative %: 64.3 % (ref 43.0–77.0)
Platelets: 347 K/uL (ref 150.0–400.0)
RBC: 4.2 Mil/uL (ref 3.87–5.11)
RDW: 14.2 % (ref 11.5–15.5)
WBC: 9.6 K/uL (ref 4.0–10.5)

## 2024-01-23 LAB — HEPATIC FUNCTION PANEL
ALT: 11 U/L (ref 0–35)
AST: 18 U/L (ref 0–37)
Albumin: 4.6 g/dL (ref 3.5–5.2)
Alkaline Phosphatase: 67 U/L (ref 39–117)
Bilirubin, Direct: 0.2 mg/dL (ref 0.0–0.3)
Total Bilirubin: 0.9 mg/dL (ref 0.2–1.2)
Total Protein: 7.4 g/dL (ref 6.0–8.3)

## 2024-01-23 LAB — BASIC METABOLIC PANEL WITH GFR
BUN: 24 mg/dL — ABNORMAL HIGH (ref 6–23)
CO2: 24 meq/L (ref 19–32)
Calcium: 9.8 mg/dL (ref 8.4–10.5)
Chloride: 100 meq/L (ref 96–112)
Creatinine, Ser: 0.94 mg/dL (ref 0.40–1.20)
GFR: 57.05 mL/min — ABNORMAL LOW (ref >60.00)
Glucose, Bld: 91 mg/dL (ref 70–99)
Potassium: 3.9 meq/L (ref 3.5–5.1)
Sodium: 136 meq/L (ref 135–145)

## 2024-01-23 LAB — VITAMIN D 25 HYDROXY (VIT D DEFICIENCY, FRACTURES): VITD: 66.14 ng/mL (ref 30.00–100.00)

## 2024-01-23 LAB — LIPID PANEL
Cholesterol: 192 mg/dL (ref 0–200)
HDL: 67.4 mg/dL (ref >39.00)
LDL Cholesterol: 108 mg/dL — ABNORMAL HIGH (ref 0–99)
NonHDL: 124.41
Total CHOL/HDL Ratio: 3
Triglycerides: 81 mg/dL (ref 0.0–149.0)
VLDL: 16.2 mg/dL (ref 0.0–40.0)

## 2024-01-23 LAB — TSH: TSH: 2.08 u[IU]/mL (ref 0.35–5.50)

## 2024-01-23 LAB — HEMOGLOBIN A1C: Hgb A1c MFr Bld: 6.1 % (ref 4.6–6.5)

## 2024-01-23 LAB — VITAMIN B12: Vitamin B-12: 358 pg/mL (ref 211–911)

## 2024-01-23 NOTE — Patient Instructions (Addendum)
 Kristen Mendoza,  Thank you for taking the time for your Medicare Wellness Visit. I appreciate your continued commitment to your health goals. Please review the care plan we discussed, and feel free to reach out if I can assist you further.  Medicare recommends these wellness visits once per year to help you and your care team stay ahead of potential health issues. These visits are designed to focus on prevention, allowing your provider to concentrate on managing your acute and chronic conditions during your regular appointments.  Please note that Annual Wellness Visits do not include a physical exam. Some assessments may be limited, especially if the visit was conducted virtually. If needed, we may recommend a separate in-person follow-up with your provider.  Ongoing Care Seeing your primary care provider every 3 to 6 months helps us  monitor your health and provide consistent, personalized care. Next office visit on 01/23/2024.  You are due for a tetanus vaccine.  Keep up the good work.  Referrals If a referral was made during today's visit and you haven't received any updates within two weeks, please contact the referred provider directly to check on the status.  You have an order for:  [x]   Bone Density     Please call for appointment:  [facility] at [facility] [address] [place], [place] [address] [phone]  Make sure to wear two-piece clothing.  No lotions, powders, or deodorants the day of the appointment. Make sure to bring picture ID and insurance card.  Bring list of medications you are currently taking including any supplements.    Recommended Screenings:  Health Maintenance  Topic Date Due   Medicare Annual Wellness Visit  Never done   Zoster (Shingles) Vaccine (1 of 2) Never done   DTaP/Tdap/Td vaccine (2 - Td or Tdap) 10/06/2019   Flu Shot  12/06/2023   Pneumococcal Vaccine for age over 63  Completed   DEXA scan (bone density  measurement)  Completed   HPV Vaccine  Aged Out   Meningitis B Vaccine  Aged Out   COVID-19 Vaccine  Discontinued       01/11/2021    3:00 AM  Advanced Directives  Does Patient Have a Medical Advance Directive? Yes  Type of Estate agent of [place];Living will  Does patient want to make changes to medical advance directive? No - Patient declined  Copy of Healthcare Power of Attorney in Chart? No - copy requested   Advance Care Planning is important because it: Ensures you receive medical care that aligns with your values, goals, and preferences. Provides guidance to your family and loved ones, reducing the emotional burden of decision-making during critical moments.  Vision: Annual vision screenings are recommended for early detection of glaucoma, cataracts, and diabetic retinopathy. These exams can also reveal signs of chronic conditions such as diabetes and high blood pressure.  Dental: Annual dental screenings help detect early signs of oral cancer, gum disease, and other conditions linked to overall health, including heart disease and diabetes.  Please see the attached documents for additional preventive care recommendations.

## 2024-01-23 NOTE — Progress Notes (Signed)
 The test results show that your current treatment is OK, as the tests are stable.  Please continue the same plan.  There is no other need for change of treatment or further evaluation based on these results, at this time.  thanks

## 2024-01-23 NOTE — Progress Notes (Signed)
 Subjective:   Kristen Mendoza is a 83 y.o. who presents for a Medicare Wellness preventive visit.  As a reminder, Annual Wellness Visits don't include a physical exam, and some assessments may be limited, especially if this visit is performed virtually. We may recommend an in-person follow-up visit with your provider if needed.  Visit Complete: In person  Persons Participating in Visit: Patient assisted by daughter.  AWV Questionnaire: No: Patient Medicare AWV questionnaire was not completed prior to this visit.  Cardiac Risk Factors include: advanced age (>43men, >40 women);hypertension;Other (see comment);dyslipidemia, Risk factor comments: Asthma     Objective:    Today's Vitals   01/23/24 1308  BP: (!) 150/80  Pulse: 96  SpO2: (!) 76%  Weight: 149 lb 3.2 oz (67.7 kg)  Height: 5' 0.5 (1.537 m)   Body mass index is 28.66 kg/m.     01/23/2024    1:18 PM 01/11/2021    3:00 AM 01/10/2021    8:37 PM 01/30/2016    9:14 AM 11/09/2015   12:59 PM  Advanced Directives  Does Patient Have a Medical Advance Directive? Yes Yes Yes Yes  Yes   Type of Estate agent of [place];Living will Healthcare Power of [place];Living will Healthcare Power of [place];Living will Healthcare Power of [place];Living will  Healthcare Power of [place];Living will   Does patient want to make changes to medical advance directive?  No - Patient declined     Copy of Healthcare Power of Attorney in Chart? No - copy requested No - copy requested No - copy requested       Data saved with a previous flowsheet row definition    Current Medications (verified) Outpatient Encounter Medications as of 01/23/2024  Medication Sig   acetaminophen  (TYLENOL ) 650 MG CR tablet Take 650 mg by mouth every 8 (eight) hours as needed for pain or fever.   amLODipine  (NORVASC ) 5 MG tablet TAKE 1 TABLET (5 MG TOTAL) BY MOUTH DAILY.   DUPIXENT 300 MG/2ML SOPN Inject into the skin.   hydrOXYzine (ATARAX) 10 MG  tablet Take 10-20 mg by mouth at bedtime as needed.   Melatonin 10 MG CHEW Chew 10 mg by mouth at bedtime.   olmesartan  (BENICAR ) 20 MG tablet TAKE 1 TABLET BY MOUTH EVERY DAY   tacrolimus (PROTOPIC) 0.1 % ointment Apply topically 2 (two) times daily.   triamcinolone  cream (KENALOG) 0.1 % Apply 1 Application topically 2 (two) times daily.   trospium  (SANCTURA ) 20 MG tablet Take 1 tablet (20 mg total) by mouth at bedtime.   No facility-administered encounter medications on file as of 01/23/2024.    Allergies (verified) Patient has no known allergies.   History: Past Medical History:  Diagnosis Date   Allergic rhinitis 04/19/2010   Overview:  Allergic Rhinitis  10/1 IMO update   Anxiety state 07/21/2010   Overview:  Anxiety Disorder NOS  10/1 IMO update   Arthritis    right knee   Asthma    dx'd years ago but no current issues    Cancer (HCC)    endometrial cancer-14 yeras ago- surgery only   Hypertension    Vitamin D  deficiency 07/21/2010   Overview:  Vitamin D  Deficiency  10/1 IMO update   Past Surgical History:  Procedure Laterality Date   ABDOMINAL HYSTERECTOMY     BLADDER SURGERY     bladder tack    COLONOSCOPY  11-25-2008   polyps, tics, hems- TA x 1-perry    knee cap dislocation  40+ years ago   POLYPECTOMY     Family History  Problem Relation Age of Onset   Colon cancer Neg Hx    Colon polyps Neg Hx    Esophageal cancer Neg Hx    Rectal cancer Neg Hx    Stomach cancer Neg Hx    Breast cancer Neg Hx    Social History   Socioeconomic History   Marital status: Widowed    Spouse name: Not on file   Number of children: 2   Years of education: Not on file   Highest education level: Not on file  Occupational History   Occupation: RETIRED  Tobacco Use   Smoking status: Never   Smokeless tobacco: Never  Vaping Use   Vaping status: Never Used  Substance and Sexual Activity   Alcohol use: No    Alcohol/week: 0.0 standard drinks of alcohol   Drug use: No    Sexual activity: Not Currently  Other Topics Concern   Not on file  Social History Narrative   Lives alone has one cat/ has a neighbor    Social Drivers of Corporate investment banker Strain: Low Risk  (01/23/2024)   Overall Financial Resource Strain (CARDIA)    Difficulty of Paying Living Expenses: Not hard at all  Food Insecurity: No Food Insecurity (01/23/2024)   Hunger Vital Sign    Worried About Running Out of Food in the Last Year: Never true    Ran Out of Food in the Last Year: Never true  Transportation Needs: No Transportation Needs (01/23/2024)   PRAPARE - Administrator, Civil Service (Medical): No    Lack of Transportation (Non-Medical): No  Physical Activity: Inactive (01/23/2024)   Exercise Vital Sign    Days of Exercise per Week: 0 days    Minutes of Exercise per Session: 0 min  Stress: No Stress Concern Present (01/23/2024)   [employer] of Occupational Health - Occupational Stress Questionnaire    Feeling of Stress: Not at all  Social Connections: Socially Isolated (01/23/2024)   Social Connection and Isolation Panel    Frequency of Communication with Friends and Family: More than three times a week    Frequency of Social Gatherings with Friends and Family: Once a week    Attends Religious Services: Never    Database administrator or Organizations: No    Attends Banker Meetings: Never    Marital Status: Widowed    Tobacco Counseling Counseling given: Not Answered    Clinical Intake:  Pre-visit preparation completed: Yes  Pain : No/denies pain     BMI - recorded: 28.66 Nutritional Status: BMI 25 -29 Overweight Nutritional Risks: None Diabetes: No  Lab Results  Component Value Date   HGBA1C 5.9 08/01/2022   HGBA1C 5.7 08/04/2021     How often do you need to have someone help you when you read instructions, pamphlets, or other written materials from your doctor or pharmacy?: 1 - Never     Information entered by  :: Mickell Birdwell, RMA   Activities of Daily Living     01/23/2024   12:49 PM  In your present state of health, do you have any difficulty performing the following activities:  Hearing? 0  Vision? 0  Difficulty concentrating or making decisions? 0  Walking or climbing stairs? 0  Dressing or bathing? 0  Doing errands, shopping? 0  Comment drives some/has slowed down  Preparing Food and eating ? N  Using the  Toilet? N  In the past six months, have you accidently leaked urine? Y  Comment pads/depends  Do you have problems with loss of bowel control? N  Managing your Medications? N  Managing your Finances? N  Housekeeping or managing your Housekeeping? N    Patient Care Team: Norleen Lynwood ORN, MD as PCP - General (Internal Medicine)  I have updated your Care Teams any recent Medical Services you may have received from other providers in the past year.     Assessment:   This is a routine wellness examination for Alanys.  Hearing/Vision screen Hearing Screening - Comments:: Denies hearing difficulties   Vision Screening - Comments:: Wears eyeglasses/ [facility]   Goals Addressed   None    Depression Screen     01/23/2024    1:23 PM 02/01/2023    1:16 PM 08/01/2022    1:24 PM 01/31/2022    1:27 PM 08/04/2021    3:10 PM 08/04/2021    2:47 PM 01/26/2021    1:05 PM  PHQ 2/9 Scores  PHQ - 2 Score 0 0 0 0 0 0 0  PHQ- 9 Score 0   2       Fall Risk     01/23/2024    1:19 PM 02/01/2023    1:16 PM 08/01/2022    1:24 PM 01/31/2022    1:28 PM 08/04/2021    3:10 PM  Fall Risk   Falls in the past year? 0 0 0 1 0  Number falls in past yr: 0 0 0 0 0  Injury with Fall? 0 0 0 0 0  Risk for fall due to :  No Fall Risks     Follow up Falls evaluation completed;Falls prevention discussed Falls evaluation completed       MEDICARE RISK AT HOME:  Medicare Risk at Home Any stairs in or around the home?: Yes (2 coming in from garage) If so, are there any without handrails?: No Home free  of loose throw rugs in walkways, pet beds, electrical cords, etc?: Yes Adequate lighting in your home to reduce risk of falls?: Yes Life alert?: No Use of a cane, walker or w/c?: Yes (walker) Grab bars in the bathroom?: Yes Shower chair or bench in shower?: Yes Elevated toilet seat or a handicapped toilet?: Yes  TIMED UP AND GO:  Was the test performed?  Yes  Length of time to ambulate 10 feet: 25 sec Gait slow and steady with assistive device  Cognitive Function: 6CIT completed        01/23/2024    1:26 PM  6CIT Screen  What Year? 0 points  What month? 0 points  What time? 3 points  Count back from 20 0 points  Months in reverse 0 points  Repeat phrase 0 points  Total Score 3 points    Immunizations Immunization History  Administered Date(s) Administered   Fluad Quad(high Dose 65+) 01/26/2021, 01/31/2022   Fluad Trivalent(High Dose 65+) 02/01/2023   INFLUENZA, HIGH DOSE SEASONAL PF 02/09/2019, 01/22/2020   PFIZER(Purple Top)SARS-COV-2 Vaccination 06/19/2019, 07/12/2019   Pneumococcal Conjugate-13 08/13/2019   Pneumococcal Polysaccharide-23 10/05/2009   Tdap 10/05/2009    Screening Tests Health Maintenance  Topic Date Due   Zoster Vaccines- Shingrix (1 of 2) Never done   DTaP/Tdap/Td (2 - Td or Tdap) 10/06/2019   Influenza Vaccine  12/06/2023   Medicare Annual Wellness (AWV)  01/22/2025   Pneumococcal Vaccine: 50+ Years  Completed   DEXA SCAN  Completed  HPV VACCINES  Aged Out   Meningococcal B Vaccine  Aged Out   COVID-19 Vaccine  Discontinued    Health Maintenance Items Addressed: DEXA ordered, See Nurse Notes at the end of this note  Additional Screening:  Vision Screening: Recommended annual ophthalmology exams for early detection of glaucoma and other disorders of the eye. Is the patient up to date with their annual eye exam?  No  Who is the provider or what is the name of the office in which the patient attends annual eye exams? [facility]/ [place]  Dental Screening: Recommended annual dental exams for proper oral hygiene  Community Resource Referral / Chronic Care Management: CRR required this visit?  No   CCM required this visit?  No   Plan:    I have personally reviewed and noted the following in the patient's chart:   Medical and social history Use of alcohol, tobacco or illicit drugs  Current medications and supplements including opioid prescriptions. Patient is not currently taking opioid prescriptions. Functional ability and status Nutritional status Physical activity Advanced directives List of other physicians Hospitalizations, surgeries, and ER visits in previous 12 months Vitals Screenings to include cognitive, depression, and falls Referrals and appointments  In addition, I have reviewed and discussed with patient certain preventive protocols, quality metrics, and best practice recommendations. A written personalized care plan for preventive services as well as general preventive health recommendations were provided to patient.   Drinda Belgard L Wenonah Milo, CMA   01/23/2024   After Visit Summary: (MyChart) Due to this being a telephonic visit, the after visit summary with patients personalized plan was offered to patient via MyChart   Notes: Patient is due for a Tdap.  She is due for a DEXA and order has been placed today.

## 2024-01-23 NOTE — Progress Notes (Signed)
 Patient ID: Kristen Mendoza, female   DOB: 19-Feb-1941, 83 y.o.   MRN: [id number]         Chief Complaint:: wellness exam and low vit d, hld, htn, asthma, dementia       HPI:  Kristen Mendoza is a 83 y.o. female here for wellness exam; for tdap and shingrix at pharmacy, o/w up to date                        Also Pt denies chest pain, increased sob or doe, wheezing, orthopnea, PND, increased LE swelling, palpitations, dizziness or syncope.   Pt denies polydipsia, polyuria, or new focal neuro s/s.    Pt denies fever, wt loss, night sweats, loss of appetite, or other constitutional symptoms  Dementia overall stable symptomatically, and not assoc with behavioral changes such as hallucinations, paranoia, or agitation.   Wt Readings from Last 3 Encounters:  01/23/24 149 lb (67.6 kg)  01/23/24 149 lb 3.2 oz (67.7 kg)  02/06/23 154 lb (69.9 kg)   BP Readings from Last 3 Encounters:  01/23/24 (!) 150/80  01/23/24 (!) 150/80  02/06/23 128/81   Immunization History  Administered Date(s) Administered   Fluad Quad(high Dose 65+) 01/26/2021, 01/31/2022   Fluad Trivalent(High Dose 65+) 02/01/2023   INFLUENZA, HIGH DOSE SEASONAL PF 02/09/2019, 01/22/2020, 01/23/2024   PFIZER(Purple Top)SARS-COV-2 Vaccination 06/19/2019, 07/12/2019   Pneumococcal Conjugate-13 08/13/2019   Pneumococcal Polysaccharide-23 10/05/2009   Tdap 10/05/2009   Health Maintenance Due  Topic Date Due   Zoster Vaccines- Shingrix (1 of 2) Never done   DTaP/Tdap/Td (2 - Td or Tdap) 10/06/2019      Past Medical History:  Diagnosis Date   Allergic rhinitis 04/19/2010   Overview:  Allergic Rhinitis  10/1 IMO update   Anxiety state 07/21/2010   Overview:  Anxiety Disorder NOS  10/1 IMO update   Arthritis    right knee   Asthma    dx'd years ago but no current issues    Cancer (HCC)    endometrial cancer-14 yeras ago- surgery only   Hypertension    Vitamin D  deficiency 07/21/2010   Overview:  Vitamin D  Deficiency  10/1 IMO update    Past Surgical History:  Procedure Laterality Date   ABDOMINAL HYSTERECTOMY     BLADDER SURGERY     bladder tack    COLONOSCOPY  11-25-2008   polyps, tics, hems- TA x 1-perry    knee cap dislocation     40+ years ago   POLYPECTOMY      reports that she has never smoked. She has never used smokeless tobacco. She reports that she does not drink alcohol and does not use drugs. family history is not on file. No Known Allergies Current Outpatient Medications on File Prior to Visit  Medication Sig Dispense Refill   acetaminophen  (TYLENOL ) 650 MG CR tablet Take 650 mg by mouth every 8 (eight) hours as needed for pain or fever.     amLODipine  (NORVASC ) 5 MG tablet TAKE 1 TABLET (5 MG TOTAL) BY MOUTH DAILY. 90 tablet 3   DUPIXENT 300 MG/2ML SOPN Inject into the skin.     hydrOXYzine (ATARAX) 10 MG tablet Take 10-20 mg by mouth at bedtime as needed.     olmesartan  (BENICAR ) 20 MG tablet TAKE 1 TABLET BY MOUTH EVERY DAY 90 tablet 3   tacrolimus (PROTOPIC) 0.1 % ointment Apply topically 2 (two) times daily.     triamcinolone  cream (KENALOG) 0.1 %  Apply 1 Application topically 2 (two) times daily.     trospium  (SANCTURA ) 20 MG tablet Take 1 tablet (20 mg total) by mouth at bedtime. 30 tablet 5   Melatonin 10 MG CHEW Chew 10 mg by mouth at bedtime.     No current facility-administered medications on file prior to visit.        ROS:  All others reviewed and negative.  Objective        PE:  BP (!) 150/80 (BP Location: Left Arm, Patient Position: Sitting, Cuff Size: Normal)   Pulse 76   Ht 5' 0.5 (1.537 m)   Wt 149 lb (67.6 kg)   BMI 28.62 kg/m                 Constitutional: Pt appears in NAD               HENT: Head: NCAT.                Right Ear: External ear normal.                 Left Ear: External ear normal.                Eyes: . Pupils are equal, round, and reactive to light. Conjunctivae and EOM are normal               Nose: without d/c or deformity               Neck:  Neck supple. Gross normal ROM               Cardiovascular: Normal rate and regular rhythm.                 Pulmonary/Chest: Effort normal and breath sounds without rales or wheezing.                Abd:  Soft, NT, ND, + BS, no organomegaly               Neurological: Pt is alert. At baseline orientation, motor grossly intact               Skin: Skin is warm. No rashes, no other new lesions, LE edema - none               Psychiatric: Pt behavior is normal without agitation   Micro: none  Cardiac tracings I have personally interpreted today:  none  Pertinent Radiological findings (summarize): none   Lab Results  Component Value Date   WBC 9.6 01/23/2024   HGB 13.1 01/23/2024   HCT 39.3 01/23/2024   PLT 347.0 01/23/2024   GLUCOSE 91 01/23/2024   CHOL 192 01/23/2024   TRIG 81.0 01/23/2024   HDL 67.40 01/23/2024   LDLCALC 108 (H) 01/23/2024   ALT 11 01/23/2024   AST 18 01/23/2024   NA 136 01/23/2024   K 3.9 01/23/2024   CL 100 01/23/2024   CREATININE 0.94 01/23/2024   BUN 24 (H) 01/23/2024   CO2 24 01/23/2024   TSH 2.08 01/23/2024   HGBA1C 6.1 01/23/2024   Assessment/Plan:  Kristen Mendoza is a 83 y.o. White or Caucasian [1] female with  has a past medical history of Allergic rhinitis (04/19/2010), Anxiety state (07/21/2010), Arthritis, Asthma, Cancer (HCC), Hypertension, and Vitamin D  deficiency (07/21/2010).  Encounter for well adult exam with abnormal findings Age and sex appropriate education and counseling updated with regular exercise and diet Referrals for preventative services -  none needed Immunizations addressed - for tdap and shingrix at pharmacy Smoking counseling  - none needed Evidence for depression or other mood disorder - none significant Most recent labs reviewed. I have personally reviewed and have noted: 1) the patient's medical and social history 2) The patient's current medications and supplements 3) The patient's height, weight, and BMI have been  recorded in the chart   Vitamin D  deficiency Last vitamin D  Lab Results  Component Value Date   VD25OH 66.14 01/23/2024   Stable, cont oral replacement   Hypertension BP Readings from Last 3 Encounters:  01/23/24 (!) 150/80  01/23/24 (!) 150/80  02/06/23 128/81   uncontrolled, pt to continue medical treatment norvasc  5 every day,  benicar  20 every day - declines change in tx, Mendoza cont to monitor at home   Dementia (HCC) Remains overall stable, declines need for change in tx  Asthma Stable, declines need for abluterol hfa  HLD (hyperlipidemia) Lab Results  Component Value Date   LDLCALC 108 (H) 01/23/2024   uncontrolled, pt for lower chol diet, declines statin  Followup: Return in about 1 year (around 01/22/2025).  Lynwood Rush, MD 01/25/2024 6:52 PM [facility]

## 2024-01-23 NOTE — Patient Instructions (Signed)
 You had the flu shot today  Please continue to take OTC Vitamin D3 at 2000 units per day, indefinitely  Please continue all other medications as before, and refills have been done if requested.  Please have the pharmacy call with any other refills you may need.  Please continue your efforts at being more active, low cholesterol diet, and weight control.  You are otherwise up to date with prevention measures today.  Please keep your appointments with your specialists as you may have planned  Please go to the LAB at the blood drawing area for the tests to be done  You will be contacted by phone if any changes need to be made immediately.  Otherwise, you will receive a letter about your results with an explanation, but please check with MyChart first.  Please make an Appointment to return for your 1 year visit, or sooner if needed

## 2024-01-25 ENCOUNTER — Encounter: Payer: Self-pay | Admitting: Internal Medicine

## 2024-01-25 NOTE — Assessment & Plan Note (Signed)
 Remains overall stable, declines need for change in tx

## 2024-01-25 NOTE — Assessment & Plan Note (Signed)
 Last vitamin D  Lab Results  Component Value Date   VD25OH 66.14 01/23/2024   Stable, cont oral replacement

## 2024-01-25 NOTE — Assessment & Plan Note (Signed)
 Stable, declines need for abluterol hfa

## 2024-01-25 NOTE — Assessment & Plan Note (Signed)
 BP Readings from Last 3 Encounters:  01/23/24 (!) 150/80  01/23/24 (!) 150/80  02/06/23 128/81   uncontrolled, pt to continue medical treatment norvasc  5 every day,  benicar  20 every day - declines change in tx, will cont to monitor at home

## 2024-01-25 NOTE — Assessment & Plan Note (Signed)

## 2024-01-25 NOTE — Assessment & Plan Note (Signed)
 Lab Results  Component Value Date   LDLCALC 108 (H) 01/23/2024   uncontrolled, pt for lower chol diet, declines statin

## 2025-01-27 ENCOUNTER — Ambulatory Visit

## 2025-01-27 ENCOUNTER — Ambulatory Visit: Admitting: Internal Medicine
